# Patient Record
Sex: Female | Born: 1935 | Race: White | Hispanic: No | Marital: Married | Smoking: Never smoker
Health system: Southern US, Community
[De-identification: ages and names within clinical notes are randomized; demographics above are authoritative.]

## PROBLEM LIST (undated history)

## (undated) DIAGNOSIS — T7840XA Allergy, unspecified, initial encounter: Secondary | ICD-10-CM

## (undated) DIAGNOSIS — M810 Age-related osteoporosis without current pathological fracture: Secondary | ICD-10-CM

## (undated) DIAGNOSIS — I809 Phlebitis and thrombophlebitis of unspecified site: Secondary | ICD-10-CM

## (undated) DIAGNOSIS — I10 Essential (primary) hypertension: Secondary | ICD-10-CM

## (undated) HISTORY — DX: Allergy, unspecified, initial encounter: T78.40XA

---

## 2000-05-21 ENCOUNTER — Emergency Department (HOSPITAL_COMMUNITY): Admission: EM | Admit: 2000-05-21 | Discharge: 2000-05-21 | Payer: Self-pay | Admitting: Emergency Medicine

## 2000-05-21 ENCOUNTER — Encounter: Payer: Self-pay | Admitting: Emergency Medicine

## 2001-07-11 ENCOUNTER — Encounter: Payer: Self-pay | Admitting: Internal Medicine

## 2001-07-11 ENCOUNTER — Ambulatory Visit (HOSPITAL_COMMUNITY): Admission: RE | Admit: 2001-07-11 | Discharge: 2001-07-11 | Payer: Self-pay | Admitting: Internal Medicine

## 2009-02-18 ENCOUNTER — Emergency Department (HOSPITAL_COMMUNITY): Admission: EM | Admit: 2009-02-18 | Discharge: 2009-02-18 | Payer: Self-pay | Admitting: Emergency Medicine

## 2009-02-18 ENCOUNTER — Ambulatory Visit: Payer: Self-pay | Admitting: Vascular Surgery

## 2009-02-18 ENCOUNTER — Encounter (INDEPENDENT_AMBULATORY_CARE_PROVIDER_SITE_OTHER): Payer: Self-pay | Admitting: Emergency Medicine

## 2011-11-13 ENCOUNTER — Ambulatory Visit (INDEPENDENT_AMBULATORY_CARE_PROVIDER_SITE_OTHER): Payer: Medicare Other | Admitting: Emergency Medicine

## 2011-11-13 DIAGNOSIS — B353 Tinea pedis: Secondary | ICD-10-CM

## 2011-11-13 MED ORDER — TERBINAFINE HCL 250 MG PO TABS: 250.0000 mg | ORAL_TABLET | Freq: Every day | ORAL | Status: DC

## 2011-11-13 MED ORDER — TERBINAFINE HCL 250 MG PO TABS: 250.0000 mg | ORAL_TABLET | Freq: Every day | ORAL | Status: AC

## 2011-11-13 NOTE — Progress Notes (Signed)
   Date:  11/13/2011   Name:  Jillian Barker   DOB:  May 29, 1928   MRN:  [id number]  PCP:  No primary provider on file.    Chief Complaint: Rash   History of Present Illness:  Jillian Barker is a 78 y.o. very pleasant female patient who presents with the following:  Expanding red scaling rash on both feet starts on soles and now advancing.  Clearing in centers.  There is no problem list on file for this patient.   No past medical history on file.  No past surgical history on file.  History  Substance Use Topics  . Smoking status: Not on file  . Smokeless tobacco: Not on file  . Alcohol Use: Not on file    No family history on file.  Allergies  Allergen Reactions  . Penicillins Other (See Comments)    Pass out     Medication list has been reviewed and updated.  Current Outpatient Prescriptions on File Prior to Visit  Medication Sig Dispense Refill  . triamterene-hydrochlorothiazide (MAXZIDE-25) 37.5-25 MG per tablet Take 1 tablet by mouth daily.        Review of Systems:  As per HPI, otherwise negative.    Physical Examination: There were no vitals filed for this visit. There were no vitals filed for this visit. There is no height or weight on file to calculate BMI. Ideal Body Weight:     GEN: WDWN, NAD, Non-toxic, Alert & Oriented x 3 HEENT: Atraumatic, Normocephalic.  Ears and Nose: No external deformity. EXTR: No clubbing/cyanosis/edema NEURO: Normal gait.  PSYCH: Normally interactive. Conversant. Not depressed or anxious appearing.  Calm demeanor.  Tinea pedis   Assessment and Plan: Tinea pedis Lamisil Follow up as needed  Carmelina Dane, MD

## 2011-11-13 NOTE — Patient Instructions (Signed)
Athlete's Foot Athlete's foot (tinea pedis) is a fungal infection of the skin on the feet. It often occurs on the skin between the toes or underneath the toes. It can also occur on the soles of the feet. Athlete's foot is more likely to occur in hot, humid weather. Not washing your feet or changing your socks often enough can contribute to athlete's foot. The infection can spread from person to person (contagious). CAUSES Athlete's foot is caused by a fungus. This fungus thrives in warm, moist places. Most people get athlete's foot by sharing shower stalls, towels, and wet floors with an infected person. People with weakened immune systems, including those with diabetes, may be more likely to get athlete's foot. SYMPTOMS   Itchy areas between the toes or on the soles of the feet.   White, flaky, or scaly areas between the toes or on the soles of the feet.   Tiny, intensely itchy blisters between the toes or on the soles of the feet.   Tiny cuts on the skin. These cuts can develop a bacterial infection.   Thick or discolored toenails.  DIAGNOSIS  Your caregiver can usually tell what the problem is by doing a physical exam. Your caregiver may also take a skin sample from the rash area. The skin sample may be examined under a microscope, or it may be tested to see if fungus will grow in the sample. A sample may also be taken from your toenail for testing. TREATMENT  Over-the-counter and prescription medicines can be used to kill the fungus. These medicines are available as powders or creams. Your caregiver can suggest medicines for you. Fungal infections respond slowly to treatment. You may need to continue using your medicine for several weeks. PREVENTION   Do not share towels.   Wear sandals in wet areas, such as shared locker rooms and shared showers.   Keep your feet dry. Wear shoes that allow air to circulate. Wear cotton or wool socks.  HOME CARE INSTRUCTIONS   Take medicines as  directed by your caregiver. Do not use steroid creams on athlete's foot.   Keep your feet clean and cool. Wash your feet daily and dry them thoroughly, especially between your toes.   Change your socks every day. Wear cotton or wool socks. In hot climates, you may need to change your socks 2 to 3 times per day.   Wear sandals or canvas tennis shoes with good air circulation.   If you have blisters, soak your feet in Burow's solution or Epsom salts for 20 to 30 minutes, 2 times a day to dry out the blisters. Make sure you dry your feet thoroughly afterward.  SEEK MEDICAL CARE IF:   You have a fever.   You have swelling, soreness, warmth, or redness in your foot.   You are not getting better after 7 days of treatment.   You are not completely cured after 30 days.   You have any problems caused by your medicines.  MAKE SURE YOU:   Understand these instructions.   Will watch your condition.   Will get help right away if you are not doing well or get worse.  Document Released: 04/10/2000 Document Revised: 04/02/2011 Document Reviewed: 01/30/2011 [employer] Patient Information 2012 [employer]. 

## 2016-07-04 ENCOUNTER — Emergency Department (HOSPITAL_COMMUNITY)
Admission: EM | Admit: 2016-07-04 | Discharge: 2016-07-04 | Disposition: A | Discharge status: Home / Self Care | Payer: Medicare Other | Attending: Emergency Medicine | Admitting: Emergency Medicine

## 2016-07-04 ENCOUNTER — Encounter (HOSPITAL_COMMUNITY): Payer: Self-pay | Admitting: Nurse Practitioner

## 2016-07-04 ENCOUNTER — Emergency Department (HOSPITAL_COMMUNITY): Payer: Medicare Other

## 2016-07-04 DIAGNOSIS — M25561 Pain in right knee: Secondary | ICD-10-CM | POA: Diagnosis present

## 2016-07-04 DIAGNOSIS — M1711 Unilateral primary osteoarthritis, right knee: Secondary | ICD-10-CM | POA: Insufficient documentation

## 2016-07-04 DIAGNOSIS — I10 Essential (primary) hypertension: Secondary | ICD-10-CM | POA: Diagnosis not present

## 2016-07-04 HISTORY — DX: Age-related osteoporosis without current pathological fracture: M81.0

## 2016-07-04 HISTORY — DX: Essential (primary) hypertension: I10

## 2016-07-04 HISTORY — DX: Phlebitis and thrombophlebitis of unspecified site: I80.9

## 2016-07-04 MED ORDER — ACETAMINOPHEN 325 MG PO TABS
650.0000 mg | ORAL_TABLET | Freq: Once | ORAL | Status: AC
  Administered 2016-07-04: 650 mg via ORAL
  Filled 2016-07-04: qty 2

## 2016-07-04 MED ORDER — IBUPROFEN 200 MG PO TABS
200.0000 mg | ORAL_TABLET | Freq: Once | ORAL | Status: AC
  Administered 2016-07-04: 200 mg via ORAL
  Filled 2016-07-04: qty 1

## 2016-07-04 NOTE — ED Triage Notes (Signed)
Patient presents to [facility] via [facility] from home with complaints of pain in the right knee with new swelling. She says the pain became unbearable. She denies injury, was able to bear weight and was able to descend stairs with EMS.

## 2016-07-04 NOTE — ED Notes (Signed)
Bed: WA03 Expected date:  Expected time:  Means of arrival:  Comments: 83 yo R knee swelling

## 2016-07-04 NOTE — ED Provider Notes (Signed)
[facility] DEPT Provider Note   CSN: [id number] Arrival date & time: 07/04/16  1321     History   Chief Complaint Chief Complaint  Patient presents with  . Knee Pain    right    HPI Jillian Barker is a 83 y.o. female.  Patient c/o right knee pain the past couple days. Pain constant, dull, moderate, worse w twisting/moving certain ways. No redness. No fever or chills. No hip or ankle pain. Denies calf pain or swelling. No numbness/weakness. States hx 'cartilage injury' to that knee previously.    The history is provided by the patient.  Knee Pain   Pertinent negatives include no numbness.    Past Medical History:  Diagnosis Date  . Hypertension   . Osteoporosis   . Superficial thrombophlebitis     There are no active problems to display for this patient.   History reviewed. No pertinent surgical history.  OB History    No data available       Home Medications    Prior to Admission medications   Medication Sig Start Date End Date Taking? Authorizing Provider  triamterene-hydrochlorothiazide (MAXZIDE-25) 37.5-25 MG per tablet Take 1 tablet by mouth daily.    Historical Provider, MD    Family History No family history on file.  Social History Social History  Substance Use Topics  . Smoking status: Not on file  . Smokeless tobacco: Not on file  . Alcohol use Not on file     Allergies   Penicillins   Review of Systems Review of Systems  Constitutional: Negative for chills and fever.  Cardiovascular: Negative for leg swelling.  Skin: Negative for rash and wound.  Neurological: Negative for numbness.     Physical Exam Updated Vital Signs BP 175/66 (BP Location: Right Arm)   Pulse 60   Temp 97.9 F (36.6 C) (Oral)   Resp 17   SpO2 100%   Physical Exam  Constitutional: She appears well-developed and well-nourished. No distress.  Eyes: Conjunctivae are normal. No scleral icterus.  Neck: Neck supple. No tracheal deviation present.    Cardiovascular: Normal rate and intact distal pulses.   Pulmonary/Chest: Effort normal. No respiratory distress.  Abdominal: Normal appearance.  Musculoskeletal: She exhibits no edema.  Mild tenderness right knee. Knee grossly stable. No large effusion. No erythema or increased warmth. Distal pulses palp. No pain w passive rom knee.   Neurological: She is alert.  Skin: Skin is warm and dry. No rash noted. She is not diaphoretic.  Psychiatric: She has a normal mood and affect.  Nursing note and vitals reviewed.    ED Treatments / Results  Labs (all labs ordered are listed, but only abnormal results are displayed) Labs Reviewed - No data to display  EKG  EKG Interpretation None       Radiology Dg Knee Complete 4 Views Right  Result Date: 07/04/2016 CLINICAL DATA:  Worsening right knee pain and swelling. No reported injury. EXAM: RIGHT KNEE - COMPLETE 4+ VIEW COMPARISON:  None. FINDINGS: Moderate suprapatellar right knee joint effusion. No fracture, dislocation or suspicious focal osseous lesion. Chondrocalcinosis in the medial and lateral menisci. Severe tricompartmental osteoarthritis, most prominent in the patellofemoral greater than medial compartments. Vascular calcifications in the posterior soft tissues. IMPRESSION: 1. No fracture or dislocation . 2. Moderate suprapatellar right knee joint effusion. 3. 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 Electronically Signed   By: Jannifer RodneyJason A Poff M.D.  On: 07/04/2016 14:25    Procedures Procedures (including critical care time)  Medications Ordered in ED Medications - No data to display   Initial Impression / Assessment and Plan / ED Course  I have reviewed the triage vital signs and the nursing notes.  Pertinent labs & imaging results that were available during my care of the patient were reviewed by me and  considered in my medical decision making (see chart for details).  Xrays.  Motrin po.  Reviewed nursing notes and prior charts for additional history.   No sign of infection on current exam.  Pt appears stable for d/c.  rec ortho f/u.  Return precautions provided.     Final Clinical Impressions(s) / ED Diagnoses   Final diagnoses:  None    New Prescriptions New Prescriptions   No medications on file     Cathren Laine, MD 07/04/16 1622

## 2016-07-04 NOTE — Discharge Instructions (Signed)
It was our pleasure to provide your ER care today - we hope that you feel better.  Take acetaminophen, and/or ibuprofen as need for pain.  Apply cold/icepack to area intermittently to help with symptom relief.  Follow up with orthopedist in the next couple weeks - see referral - call office Monday to arrange follow up.  Return to ER if worse, new symptoms, fevers, spreading redness/swelling, intractable pain, other concern.

## 2016-08-11 ENCOUNTER — Ambulatory Visit (INDEPENDENT_AMBULATORY_CARE_PROVIDER_SITE_OTHER): Payer: Medicare Other | Admitting: Family Medicine

## 2016-08-11 VITALS — BP 157/76 | HR 72 | Temp 98.5°F | Resp 12 | Ht 59.0 in | Wt 133.0 lb

## 2016-08-11 DIAGNOSIS — J3489 Other specified disorders of nose and nasal sinuses: Secondary | ICD-10-CM | POA: Diagnosis not present

## 2016-08-11 DIAGNOSIS — H00025 Hordeolum internum left lower eyelid: Secondary | ICD-10-CM | POA: Diagnosis not present

## 2016-08-11 DIAGNOSIS — H04203 Unspecified epiphora, bilateral lacrimal glands: Secondary | ICD-10-CM | POA: Diagnosis not present

## 2016-08-11 MED ORDER — ERYTHROMYCIN 5 MG/GM OP OINT: 1.0000 "application " | TOPICAL_OINTMENT | Freq: Three times a day (TID) | OPHTHALMIC | 0 refills | Status: AC

## 2016-08-11 NOTE — Patient Instructions (Addendum)
You appear to have a stye on the left lower eyelid. I do not see signs of infection around the eyelid at this time, but would recommend using the antibiotic ointment 1/2 inch ribbon to the lower eyelid 3 times per day, and warm compresses 4-5 times per day.   For the watery eyes, you can use over-the-counter Systane or Refresh until seen by your eye care provider to discuss other treatments and testing. Please follow-up with your eye care provider within the next 3 days to recheck left eyelid as well as discuss other eye symptoms.   Eye symptoms and nose/sinus symptoms may be related to allergies. Try Claritin over-the-counter once per day. If any sedation, lightheadedness or dizziness with Claritin, do not use that any further. For nasal crusting and irritation, I would recommend over-the-counter saline nasal spray 3-4 times per day.  Return to the clinic or go to the nearest emergency room if any of your symptoms worsen or new symptoms occur.  Stye A stye is a bump on your eyelid caused by a bacterial infection. A stye can form inside the eyelid (internal stye) or outside the eyelid (external stye). An internal stye may be caused by an infected oil-producing gland inside your eyelid. An external stye may be caused by an infection at the base of your eyelash (hair follicle). Styes are very common. Anyone can get them at any age. They usually occur in just one eye, but you may have more than one in either eye. What are the causes? The infection is almost always caused by bacteria called Staphylococcus aureus. This is a common type of bacteria that lives on your skin. What increases the risk? You may be at higher risk for a stye if you have had one before. You may also be at higher risk if you have:  Diabetes.  Long-term illness.  Long-term eye redness.  A skin condition called seborrhea.  High fat levels in your blood (lipids). What are the signs or symptoms? Eyelid pain is the most  common symptom of a stye. Internal styes are more painful than external styes. Other signs and symptoms may include:  Painful swelling of your eyelid.  A scratchy feeling in your eye.  Tearing and redness of your eye.  Pus draining from the stye. How is this diagnosed? Your health care provider may be able to diagnose a stye just by examining your eye. The health care provider may also check to make sure:  You do not have a fever or other signs of a more serious infection.  The infection has not spread to other parts of your eye or areas around your eye. How is this treated? Most styes will clear up in a few days without treatment. In some cases, you may need to use antibiotic drops or ointment to prevent infection. Your health care provider may have to drain the stye surgically if your stye is:  Large.  Causing a lot of pain.  Interfering with your vision. This can be done using a thin blade or a needle. Follow these instructions at home:  Take medicines only as directed by your health care provider.  Apply a clean, warm compress to your eye for 10 minutes, 4 times a day.  Do not wear contact lenses or eye makeup until your stye has healed.  Do not try to pop or drain the stye. Contact a health care provider if:  You have chills or a fever.  Your stye does not go away  after several days.  Your stye affects your vision.  Your eyeball becomes swollen, red, or painful. This information is not intended to replace advice given to you by your health care provider. Make sure you discuss any questions you have with your health care provider. Document Released: 01/21/2005 Document Revised: 12/08/2015 Document Reviewed: 07/28/2013 Elsevier Interactive Patient Education  2017 [employer].    IF you received an x-ray today, you will receive an invoice from [facility]. Please contact [facility] at [phone] with questions or concerns regarding your  invoice.   IF you received labwork today, you will receive an invoice from [facility]. Please contact [facility] at [phone] with questions or concerns regarding your invoice.   Our billing staff will not be able to assist you with questions regarding bills from these companies.  You will be contacted with the lab results as soon as they are available. The fastest way to get your results is to activate your My Chart account. Instructions are located on the last page of this paperwork. If you have not heard from [place] regarding the results in 2 weeks, please contact this office.

## 2016-08-11 NOTE — Progress Notes (Signed)
Subjective:  By signing my name below, I, Jillian Barker, attest that this documentation has been prepared under the direction and in the presence of [name], MD Electronically Signed: Charline Bills, ED Scribe 08/11/2016 at 4:14 PM.   Patient ID: Jillian Barker, female    DOB: Dec 30, 1928, 83 y.o.   MRN: [id number]  Chief Complaint  Patient presents with  . Sinusitis    watery, itchy eyes  . other    pt would not cooperate for temperature   HPI  Jillian Barker is a 83 y.o. female who presents to Primary Care at [facility] complaining of bilateral itchy and watery eyes over the past 4 days. Pt states symptoms started as nasal congestion 4 months ago that was never treated. She reports associated symptoms of rhinorrhea, swelling surrounding both eyes and a stye forming to the left eye over the past few days. Pt has tried ibuprofen and applying a warm compress to her left eye without significant relief. She denies fever, sinus pressure, dental pain. Pt reports a h/o hay fever that she has occasionally treated with OTC antihistamines in the past. She is followed by optometrist Dr. Burgess Estelle.   There are no active problems to display for this patient.  Past Medical History:  Diagnosis Date  . Hypertension   . Osteoporosis   . Superficial thrombophlebitis    No past surgical history on file. Allergies  Allergen Reactions  . Penicillins Other (See Comments)    Pass out .Marland Kitchenas a child    Prior to Admission medications   Medication Sig Start Date End Date Taking? Authorizing Provider  CALCIUM-VITAMIN D PO Take 1 tablet by mouth daily.   Yes Historical Provider, MD  ibuprofen (ADVIL,MOTRIN) 200 MG tablet Take 400 mg by mouth every 6 (six) hours as needed.   Yes Historical Provider, MD  triamterene-hydrochlorothiazide (MAXZIDE-25) 37.5-25 MG per tablet Take 1 tablet by mouth daily.   Yes Historical Provider, MD  alendronate (FOSAMAX) 70 MG tablet Take 70 mg by mouth once a week.  05/09/16   Historical Provider, MD   Social History   Social History  . Marital status: Married    Spouse name: N/A  . Number of children: N/A  . Years of education: N/A   Occupational History  . Not on file.   Social History Main Topics  . Smoking status: Never Smoker  . Smokeless tobacco: Never Used  . Alcohol use Not on file  . Drug use: Unknown  . Sexual activity: Not on file   Other Topics Concern  . Not on file   Social History Narrative  . No narrative on file   Review of Systems  Constitutional: Negative for fever.  HENT: Positive for congestion, facial swelling (bilateral eyes) and rhinorrhea. Negative for dental problem and sinus pressure.   Eyes: Positive for discharge (watery) and itching.  Allergic/Immunologic: Positive for environmental allergies.      Objective:   Physical Exam  Constitutional: She is oriented to person, place, and time. She appears well-developed and well-nourished. No distress.  HENT:  Head: Normocephalic and atraumatic.  Right Ear: Hearing, tympanic membrane, external ear and ear canal normal.  Left Ear: Hearing, tympanic membrane, external ear and ear canal normal.  Mouth/Throat: Oropharynx is clear and moist and mucous membranes are normal. No oropharyngeal exudate.  Mouth/Throat: No postnasal drip.  Nose: Minimal dry mucosa in the nasal passages. Septum is normal. No bleeding. Unknown if she is having pain in sinuses.  Eyes:  EOM are normal. Pupils are equal, round, and reactive to light. Right conjunctiva is injected (minimal). Left conjunctiva is injected (minimal). Right eye exhibits no nystagmus. Left eye exhibits no nystagmus.  Hordeolum on lower medial third on L lower eyelid with some swelling of the infraorbital skin. No erythema of the infraorbital skin or rash. No rash along face or skin.  Neck: Neck supple. No tracheal deviation present.  Cardiovascular: Normal rate, regular rhythm, normal heart sounds and intact distal  pulses.   No murmur heard. Pulmonary/Chest: Effort normal and breath sounds normal. No respiratory distress. She has no wheezes. She has no rhonchi.  Musculoskeletal: Normal range of motion.  Lymphadenopathy:       Head (right side): No preauricular adenopathy present.       Head (left side): No preauricular adenopathy present.    She has no cervical adenopathy.  Neurological: She is alert and oriented to person, place, and time.  Skin: Skin is warm and dry. No rash noted.  Psychiatric: She has a normal mood and affect. Her behavior is normal.  Nursing note and vitals reviewed.  Vitals:   08/11/16 1603  BP: (!) 157/76  Pulse: 72  Resp: 12  SpO2: 98%  Weight: 133 lb (60.3 kg)  Height:  (1.499 m)  Declined repeat BP assessment.     Visual Acuity Screening   Right eye Left eye Both eyes  Without correction: 20/40 -1 20/30 -2 20/30 -1  With correction:       Assessment & Plan:   Jillian Barker is a 83 y.o. female Hordeolum internum of left lower eyelid - Plan: erythromycin (ROMYCIN) ophthalmic ointment  - No significant erythema or signs of preseptal cellulitis at this time.  -Warm compresses, erythromycin ointment 3 times a day, follow-up here or with eye care provider in the next few days for monitoring. RTC precautions if worsening.  Watery eyes Nasal crusting  -Possible allergic conjunctivitis versus dry eyes. Recommended symptomatic care with Systane, or refresh drops over-the-counter and follow-up with eye care provider.  -Can also try Claritin over-the-counter for possible allergic symptoms and saline nasal spray for nasal crusting.  If persistent sinus symptoms, return to discuss further but suspect allergic cause at this time.  Meds ordered this encounter  Medications  . erythromycin [facility]) ophthalmic ointment    Sig: Place 1 application into the left eye 3 (three) times daily.    Dispense:  3.5 g    Refill:  0   Patient Instructions    You appear  to have a stye on the left lower eyelid. I do not see signs of infection around the eyelid at this time, but would recommend using the antibiotic ointment 1/2 inch ribbon to the lower eyelid 3 times per day, and warm compresses 4-5 times per day.   For the watery eyes, you can use over-the-counter Systane or Refresh until seen by your eye care provider to discuss other treatments and testing. Please follow-up with your eye care provider within the next 3 days to recheck left eyelid as well as discuss other eye symptoms.   Eye symptoms and nose/sinus symptoms may be related to allergies. Try Claritin over-the-counter once per day. If any sedation, lightheadedness or dizziness with Claritin, do not use that any further. For nasal crusting and irritation, I would recommend over-the-counter saline nasal spray 3-4 times per day.  Return to the clinic or go to the nearest emergency room if any of your symptoms worsen or new symptoms  occur.  Stye A stye is a bump on your eyelid caused by a bacterial infection. A stye can form inside the eyelid (internal stye) or outside the eyelid (external stye). An internal stye may be caused by an infected oil-producing gland inside your eyelid. An external stye may be caused by an infection at the base of your eyelash (hair follicle). Styes are very common. Anyone can get them at any age. They usually occur in just one eye, but you may have more than one in either eye. What are the causes? The infection is almost always caused by bacteria called Staphylococcus aureus. This is a common type of bacteria that lives on your skin. What increases the risk? You may be at higher risk for a stye if you have had one before. You may also be at higher risk if you have:  Diabetes.  Long-term illness.  Long-term eye redness.  A skin condition called seborrhea.  High fat levels in your blood (lipids). What are the signs or symptoms? Eyelid pain is the most common symptom of  a stye. Internal styes are more painful than external styes. Other signs and symptoms may include:  Painful swelling of your eyelid.  A scratchy feeling in your eye.  Tearing and redness of your eye.  Pus draining from the stye. How is this diagnosed? Your health care provider may be able to diagnose a stye just by examining your eye. The health care provider may also check to make sure:  You do not have a fever or other signs of a more serious infection.  The infection has not spread to other parts of your eye or areas around your eye. How is this treated? Most styes will clear up in a few days without treatment. In some cases, you may need to use antibiotic drops or ointment to prevent infection. Your health care provider may have to drain the stye surgically if your stye is:  Large.  Causing a lot of pain.  Interfering with your vision. This can be done using a thin blade or a needle. Follow these instructions at home:  Take medicines only as directed by your health care provider.  Apply a clean, warm compress to your eye for 10 minutes, 4 times a day.  Do not wear contact lenses or eye makeup until your stye has healed.  Do not try to pop or drain the stye. Contact a health care provider if:  You have chills or a fever.  Your stye does not go away after several days.  Your stye affects your vision.  Your eyeball becomes swollen, red, or painful. This information is not intended to replace advice given to you by your health care provider. Make sure you discuss any questions you have with your health care provider. Document Released: 01/21/2005 Document Revised: 12/08/2015 Document Reviewed: 07/28/2013 Elsevier Interactive Patient Education  2017 [employer].    IF you received an x-ray today, you will receive an invoice from [facility]. Please contact [facility] at [phone] with questions or concerns regarding your invoice.   IF you  received labwork today, you will receive an invoice from [facility]. Please contact [facility] at [phone] with questions or concerns regarding your invoice.   Our billing staff will not be able to assist you with questions regarding bills from these companies.  You will be contacted with the lab results as soon as they are available. The fastest way to get your results is to activate your My Chart  account. Instructions are located on the last page of this paperwork. If you have not heard from [place] regarding the results in 2 weeks, please contact this office.      I personally performed the services described in this documentation, which was scribed in my presence. The recorded information has been reviewed and considered for accuracy and completeness, addended by me as needed, and agree with information above.  Signed,   Meredith Staggers, MD Primary Care at [facility].  08/11/16 7:56 PM

## 2019-01-05 IMAGING — CR DG KNEE COMPLETE 4+V*R*
4 series · 4 of 4 positions shown · non-contrast
Comparison: None.

CLINICAL DATA: Worsening right knee pain and swelling. No reported
injury.

EXAM:
RIGHT KNEE - COMPLETE 4+ VIEW

[x knee ap right (1 of 3)]
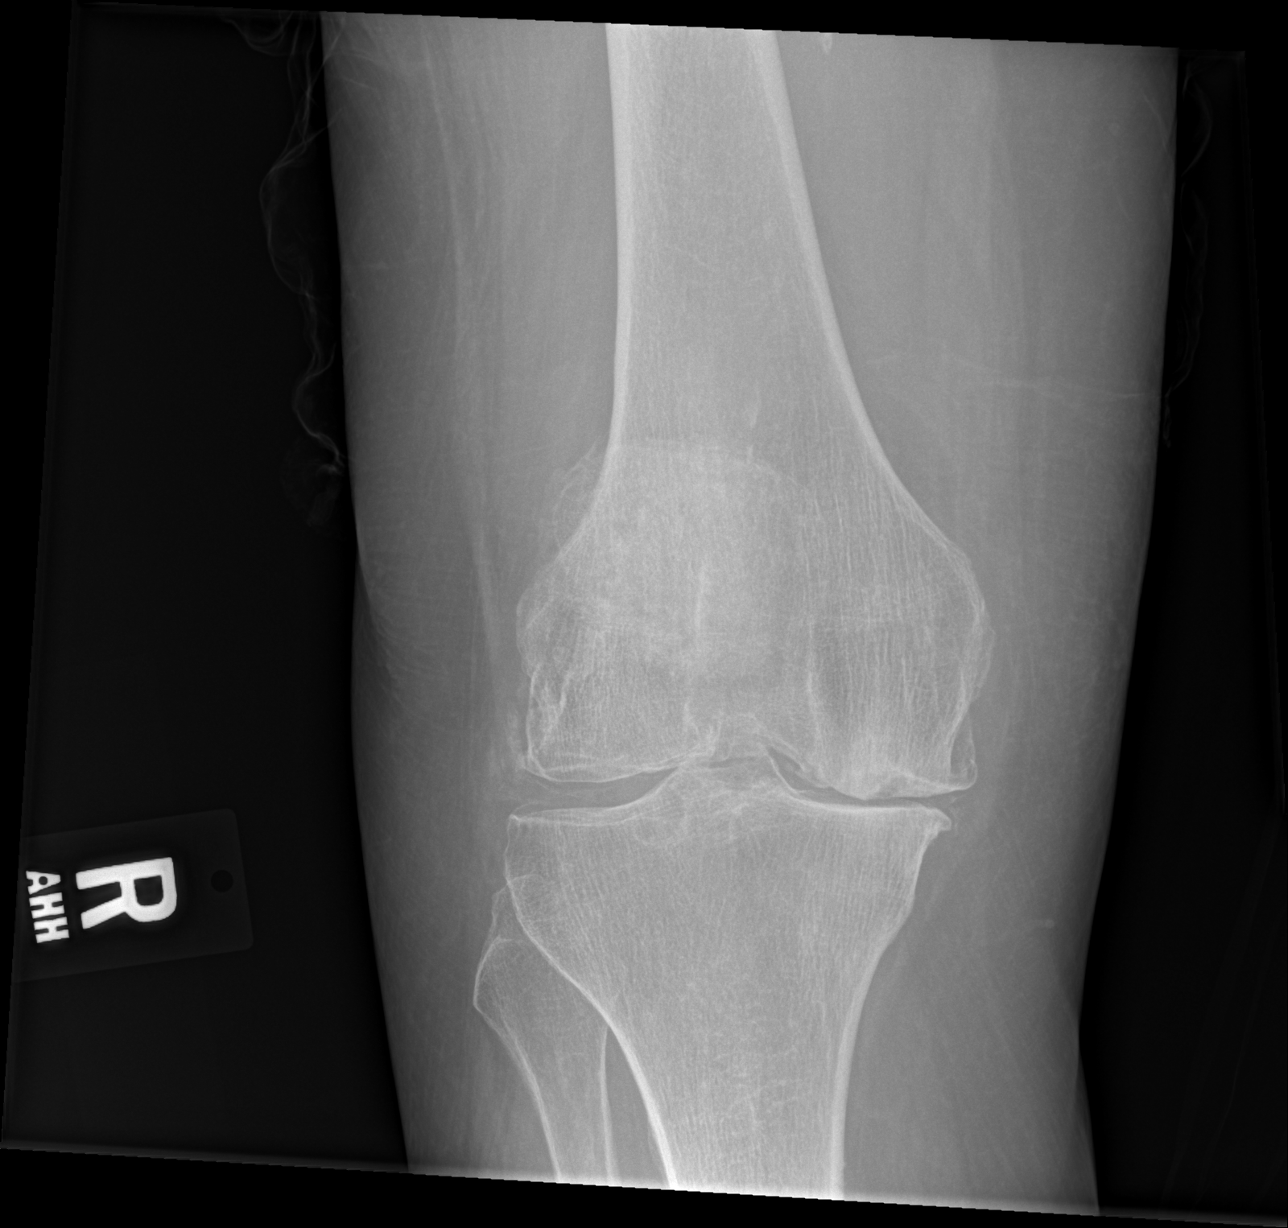

[x knee ap right (2 of 3)]
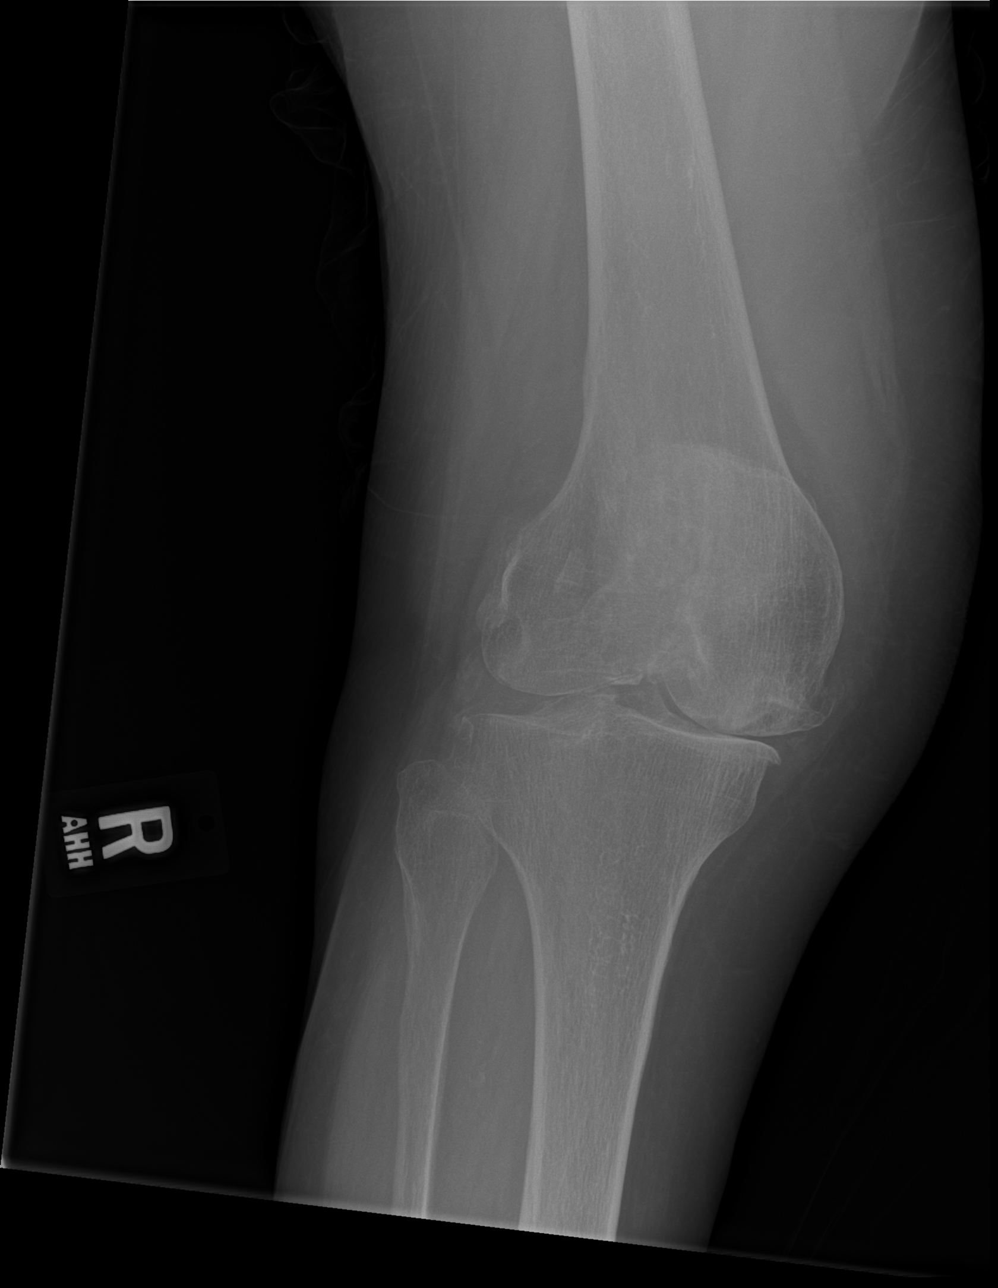

[x knee ap right (3 of 3)]
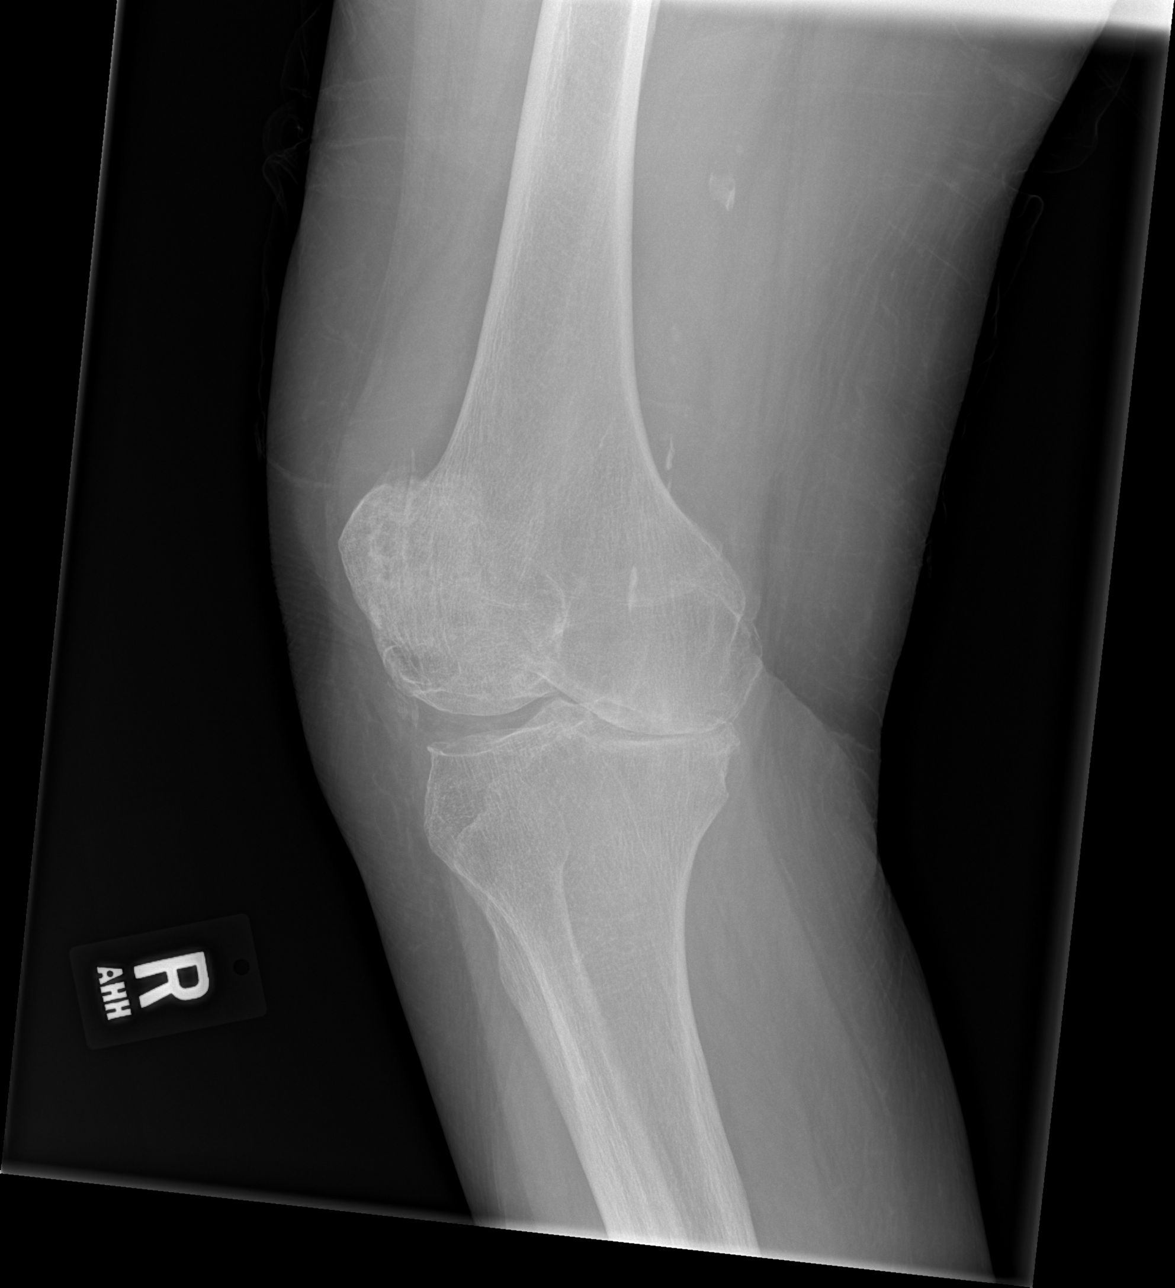

[x knee lat right]
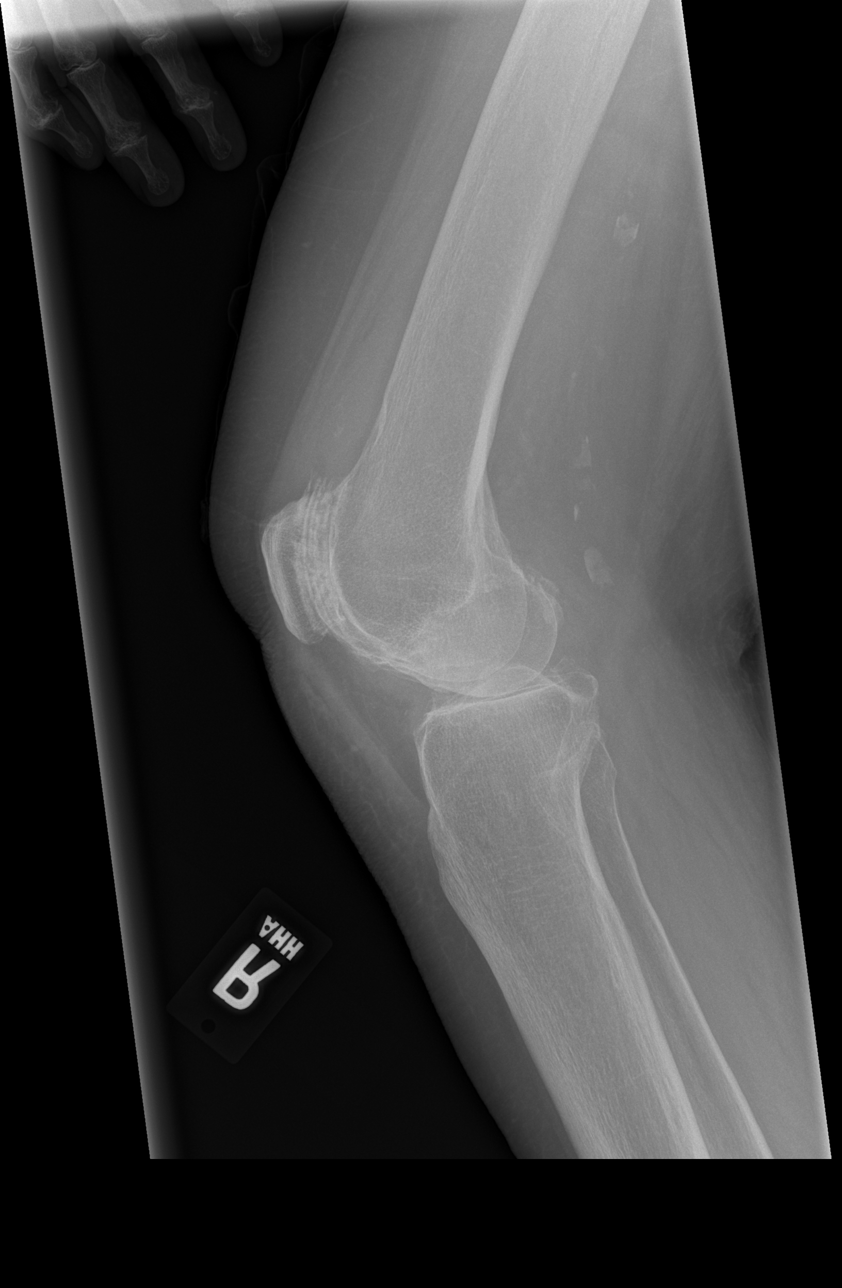

[4 of 4 positions shown; findings below may reference images not displayed]

FINDINGS: Moderate suprapatellar right knee joint effusion. No fracture,
dislocation or suspicious focal osseous lesion. Chondrocalcinosis in
the medial and lateral menisci. Severe tricompartmental
osteoarthritis, most prominent in the patellofemoral greater than
medial compartments. Vascular calcifications in the posterior soft
tissues.
IMPRESSION: 1. No fracture or dislocation .
2. Moderate suprapatellar right knee joint effusion.
3. Severe tricompartmental osteoarthritis, most prominent in the
patellofemoral compartment, probably due to CPPD arthropathy given
the meniscal chondrocalcinosis.

## 2020-04-18 ENCOUNTER — Encounter (HOSPITAL_COMMUNITY): Payer: Self-pay | Admitting: Emergency Medicine

## 2020-04-18 ENCOUNTER — Emergency Department (HOSPITAL_COMMUNITY): Payer: Medicare Other

## 2020-04-18 ENCOUNTER — Emergency Department (HOSPITAL_COMMUNITY)
Admission: EM | Admit: 2020-04-18 | Discharge: 2020-04-18 | Disposition: A | Discharge status: Home / Self Care | Payer: Medicare Other | Attending: Emergency Medicine | Admitting: Emergency Medicine

## 2020-04-18 DIAGNOSIS — S59902A Unspecified injury of left elbow, initial encounter: Secondary | ICD-10-CM | POA: Diagnosis present

## 2020-04-18 DIAGNOSIS — W19XXXA Unspecified fall, initial encounter: Secondary | ICD-10-CM

## 2020-04-18 DIAGNOSIS — G8929 Other chronic pain: Secondary | ICD-10-CM | POA: Insufficient documentation

## 2020-04-18 DIAGNOSIS — Z23 Encounter for immunization: Secondary | ICD-10-CM | POA: Insufficient documentation

## 2020-04-18 DIAGNOSIS — S51012A Laceration without foreign body of left elbow, initial encounter: Secondary | ICD-10-CM | POA: Diagnosis not present

## 2020-04-18 DIAGNOSIS — Z79899 Other long term (current) drug therapy: Secondary | ICD-10-CM | POA: Diagnosis not present

## 2020-04-18 DIAGNOSIS — M25562 Pain in left knee: Secondary | ICD-10-CM | POA: Insufficient documentation

## 2020-04-18 DIAGNOSIS — M25561 Pain in right knee: Secondary | ICD-10-CM | POA: Diagnosis not present

## 2020-04-18 DIAGNOSIS — W01198A Fall on same level from slipping, tripping and stumbling with subsequent striking against other object, initial encounter: Secondary | ICD-10-CM | POA: Diagnosis not present

## 2020-04-18 DIAGNOSIS — I10 Essential (primary) hypertension: Secondary | ICD-10-CM | POA: Diagnosis not present

## 2020-04-18 LAB — BASIC METABOLIC PANEL
Anion gap: 8 (ref 5–15)
BUN: 25 mg/dL — ABNORMAL HIGH (ref 8–23)
CO2: 24 mmol/L (ref 22–32)
Calcium: 8.8 mg/dL — ABNORMAL LOW (ref 8.9–10.3)
Chloride: 108 mmol/L (ref 98–111)
Creatinine, Ser: 0.68 mg/dL (ref 0.44–1.00)
GFR, Estimated: >60 mL/min (ref >60)
Glucose, Bld: 108 mg/dL — ABNORMAL HIGH (ref 70–99)
Potassium: 3.7 mmol/L (ref 3.5–5.1)
Sodium: 140 mmol/L (ref 135–145)

## 2020-04-18 LAB — CBG MONITORING, ED: Glucose-Capillary: 103 mg/dL — ABNORMAL HIGH (ref 70–99)

## 2020-04-18 LAB — CBC WITH DIFFERENTIAL/PLATELET
Abs Immature Granulocytes: 0.07 10*3/uL (ref 0.00–0.07)
Basophils Absolute: 0.1 10*3/uL (ref 0.0–0.1)
Basophils Relative: 0 %
Eosinophils Absolute: 0.1 10*3/uL (ref 0.0–0.5)
Eosinophils Relative: 1 %
HCT: 38.8 % (ref 36.0–46.0)
Hemoglobin: 12.7 g/dL (ref 12.0–15.0)
Immature Granulocytes: 1 %
Lymphocytes Relative: 10 %
Lymphs Abs: 1.5 10*3/uL (ref 0.7–4.0)
MCH: 31.8 pg (ref 26.0–34.0)
MCHC: 32.7 g/dL (ref 30.0–36.0)
MCV: 97 fL (ref 80.0–100.0)
Monocytes Absolute: 1 10*3/uL (ref 0.1–1.0)
Monocytes Relative: 7 %
Neutro Abs: 12.1 10*3/uL — ABNORMAL HIGH (ref 1.7–7.7)
Neutrophils Relative %: 81 %
Platelets: 199 10*3/uL (ref 150–400)
RBC: 4 MIL/uL (ref 3.87–5.11)
RDW: 14 % (ref 11.5–15.5)
WBC: 14.8 10*3/uL — ABNORMAL HIGH (ref 4.0–10.5)
nRBC: 0 % (ref 0.0–0.2)

## 2020-04-18 LAB — URINALYSIS, ROUTINE W REFLEX MICROSCOPIC
Bacteria, UA: NONE SEEN
Bilirubin Urine: NEGATIVE
Glucose, UA: NEGATIVE mg/dL
Hgb urine dipstick: NEGATIVE
Ketones, ur: NEGATIVE mg/dL
Nitrite: NEGATIVE
Protein, ur: NEGATIVE mg/dL
Specific Gravity, Urine: 1.017 (ref 1.005–1.030)
pH: 6 (ref 5.0–8.0)

## 2020-04-18 MED ORDER — ACETAMINOPHEN 500 MG PO TABS
1000.0000 mg | ORAL_TABLET | Freq: Once | ORAL | Status: AC
  Administered 2020-04-18: 01:00:00 1000 mg via ORAL
  Filled 2020-04-18: qty 2

## 2020-04-18 MED ORDER — LIDOCAINE-EPINEPHRINE (PF) 2 %-1:200000 IJ SOLN
20.0000 mL | Freq: Once | INTRAMUSCULAR | Status: AC
  Administered 2020-04-18: 20 mL via INTRADERMAL
  Filled 2020-04-18: qty 20

## 2020-04-18 MED ORDER — TETANUS-DIPHTH-ACELL PERTUSSIS 5-2.5-18.5 LF-MCG/0.5 IM SUSY
0.5000 mL | PREFILLED_SYRINGE | Freq: Once | INTRAMUSCULAR | Status: AC
  Administered 2020-04-18: 01:00:00 0.5 mL via INTRAMUSCULAR
  Filled 2020-04-18: qty 0.5

## 2020-04-18 NOTE — ED Provider Notes (Signed)
..  Laceration Repair  Date/Time: 04/18/2020 6:17 AM Performed by: Barkley Boards, PA-C Authorized by: Barkley Boards, PA-C   Consent:    Consent obtained:  Verbal   Consent given by:  Patient   Risks, benefits, and alternatives were discussed: yes     Risks discussed:  Infection, pain, poor cosmetic result, need for additional repair and poor wound healing   Alternatives discussed:  No treatment and delayed treatment Universal protocol:    Procedure explained and questions answered to patient or proxy's satisfaction: yes     Test results available: yes     Imaging studies available: yes     Required blood products, implants, devices, and special equipment available: yes     Site/side marked: yes     Immediately prior to procedure, a time out was called: yes     Patient identity confirmed:  Verbally with patient Anesthesia:    Anesthesia method:  Local infiltration   Local anesthetic:  Lidocaine 2% WITH epi Laceration details:    Location: arm.   Length (cm):  8 Pre-procedure details:    Preparation:  Patient was prepped and draped in usual sterile fashion and imaging obtained to evaluate for foreign bodies Exploration:    Hemostasis achieved with:  Direct pressure   Imaging obtained: x-ray     Imaging outcome: foreign body not noted     Wound exploration: wound explored through full range of motion and entire depth of wound visualized     Wound extent: fascia violated     Wound extent: no foreign bodies/material noted, no muscle damage noted, no nerve damage noted, no tendon damage noted, no underlying fracture noted and no vascular damage noted     Contaminated: no   Treatment:    Area cleansed with:  Saline   Amount of cleaning:  Extensive   Irrigation solution:  Sterile saline   Irrigation method:  Pressure wash   Visualized foreign bodies/material removed: no     Debridement:  None   Layers/structures repaired:  Deep dermal/superficial fascia Deep dermal/superficial  fascia:    Suture size:  3-0   Suture material:  Vicryl   Suture technique:  Simple interrupted   Number of sutures:  10 Skin repair:    Repair method:  Sutures   Suture size:  3-0   Suture material:  Prolene   Suture technique: Simple interrupted and horizontal mattress.   Number of sutures:  9 Repair type:    Repair type:  Intermediate Post-procedure details:    Dressing:  Sterile dressing and splint for protection   Procedure completion:  Tolerated well, no immediate complications      Barkley Boards, PA-C 04/18/20 0623    Ward, Layla Maw, DO 04/18/20 [phone]

## 2020-04-18 NOTE — Discharge Instructions (Addendum)
Your CT scan of your head and cervical spine showed no injury.  X-ray of your left arm showed no fracture.  We have repaired the laceration to your left elbow.  The sutures will need to be removed in approximately 10 days.  This may be done by your primary care doctor.  You may take Tylenol 1000 mg every 6 hours as needed for pain.  We have updated your tetanus vaccination today.  You may clean this wound gently with warm soap and water and apply over-the-counter Neosporin and then a dressing.  Your labs and urine showed no acute abnormality other than a slightly elevated white blood cell count which could be reactive after your fall.

## 2020-04-18 NOTE — ED Triage Notes (Signed)
Per EMS, pt from home, fell and hit her elbow and head on door. Pt is not on blood thinners, did not lose LOC. Has laceration on arm.

## 2020-04-18 NOTE — ED Provider Notes (Signed)
CHIEF COMPLAINT: Fall, left elbow laceration  HPI: Patient is an 86 year old female with history of hypertension, osteoporosis who presents to the emergency department after she fell tonight.  Has a left elbow laceration.  States that she went outside to get away from her son who was "driving me crazy" when she fell.  She is not sure why she fell.  States the storm door hit her left arm.  She is not sure if she hit her head.  She denies any headache.  She is not on blood thinners.  She complains of chronic lower extremity pain from her osteoporosis but denies any injury and has been ambulatory.  No numbness or weakness.  No preceding chest pain or shortness of breath.  No recent vomiting, diarrhea, fever, cough, bloody stools, melena.  ROS: See HPI Constitutional: no fever  Eyes: no drainage  ENT: no runny nose   Cardiovascular:  no chest pain  Resp: no SOB  GI: no vomiting GU: no dysuria Integumentary: no rash  Allergy: no hives  Musculoskeletal: no leg swelling  Neurological: no slurred speech ROS otherwise negative  PAST MEDICAL HISTORY/PAST SURGICAL HISTORY:  Past Medical History:  Diagnosis Date  . Allergy   . Hypertension   . Osteoporosis   . Superficial thrombophlebitis     MEDICATIONS:  Prior to Admission medications   Medication Sig Start Date End Date Taking? Authorizing Provider  alendronate (FOSAMAX) 70 MG tablet Take 70 mg by mouth once a week. 05/09/16   [provider]  CALCIUM-VITAMIN D PO Take 1 tablet by mouth daily.    [provider]  erythromycin [facility]) ophthalmic ointment Place 1 application into the left eye 3 (three) times daily. 08/11/16   [name], MD  ibuprofen (ADVIL,MOTRIN) 200 MG tablet Take 400 mg by mouth every 6 (six) hours as needed.    [provider]  triamterene-hydrochlorothiazide (MAXZIDE-25) 37.5-25 MG per tablet Take 1 tablet by mouth daily.    [provider]    ALLERGIES:  Allergies   Allergen Reactions  . Penicillins Other (See Comments)    Pass out .Marland Kitchenas a child     SOCIAL HISTORY:  Social History   Tobacco Use  . Smoking status: Never Smoker  . Smokeless tobacco: Never Used  Substance Use Topics  . Alcohol use: No    FAMILY HISTORY: No family history on file.  EXAM: BP (!) 156/77   Pulse 89   Temp (!) 97.5 F (36.4 C) (Oral)   Resp 18   SpO2 98%  CONSTITUTIONAL: Alert and oriented and responds appropriately to questions. Well-appearing; well-nourished; GCS 15, elderly, very hard of hearing HEAD: Normocephalic; atraumatic EYES: Conjunctivae clear, PERRL, EOMI ENT: normal nose; no rhinorrhea; moist mucous membranes; pharynx without lesions noted; no dental injury; no septal hematoma NECK: Supple, no meningismus, no LAD; no midline spinal tenderness, step-off or deformity; trachea midline CARD: RRR; S1 and S2 appreciated; no murmurs, no clicks, no rubs, no gallops RESP: Normal chest excursion without splinting or tachypnea; breath sounds clear and equal bilaterally; no wheezes, no rhonchi, no rales; no hypoxia or respiratory distress CHEST:  chest wall stable, no crepitus or ecchymosis or deformity, nontender to palpation; no flail chest ABD/GI: Normal bowel sounds; non-distended; soft, non-tender, no rebound, no guarding; no ecchymosis or other lesions noted PELVIS:  stable, nontender to palpation BACK:  The back appears normal and is non-tender to palpation, there is no CVA tenderness; no midline spinal tenderness, step-off or deformity EXT: Soft tissue  swelling and ecchymosis noted to the left elbow without bony deformity.  Compartments in the left arm are soft.  2+ right and left radial pulses on exam.  She has an 8 cm laceration to the posterior left elbow without sign of tendon involvement or foreign body.  She has tenderness over her knees bilaterally but no sign of traumatic injury.  She states this is chronic. SKIN: Normal color for age and race;  warm NEURO: Moves all extremities equally, reports normal sensation diffusely, cranial nerves II through XII intact, normal speech PSYCH: The patient's mood and manner are appropriate. Grooming and personal hygiene are appropriate.  MEDICAL DECISION MAKING: Patient here with fall.  Unclear what caused her to fall tonight.  Will obtain labs, urine.  EKG shows no ischemia, arrhythmia or interval abnormality.  Will update her tetanus vaccination.  Will repair laceration.  Will obtain x-ray of the left elbow, CT of the head and cervical spine.  She does have some pain over both knees on exam but is ambulatory here.  Have discussed obtaining x-rays which she declined stating this is chronic pain.  ED PROGRESS: Labs show mild leukocytosis which may be reactive.  She has no infectious symptoms.  Normal hemoglobin.  Normal electrolytes.  Normal glucose.  Urine does not appear infected today.  Laceration repaired.  Please see Jillian McDonald, PA-C's note.  Will place arm in sling for protection.  Discussed wound care instructions, return precautions.  She will follow up with her PCP to have her sutures removed.  Will discharge home.  She denies that anyone at home is harming her.  She feels safe at home.   At this time, I do not feel there is any life-threatening condition present. I have reviewed, interpreted and discussed all results (EKG, imaging, lab, urine as appropriate) and exam findings with patient/family. I have reviewed nursing notes and appropriate previous records.  I feel the patient is safe to be discharged home without further emergent workup and can continue workup as an outpatient as needed. Discussed usual and customary return precautions. Patient/family verbalize understanding and are comfortable with this plan.  Outpatient follow-up has been provided as needed. All questions have been answered.   EKG Interpretation  Date/Time:  Thursday April 18 2020 00:55:57 EST Ventricular Rate:  63 PR  Interval:    QRS Duration: 95 QT Interval:  413 QTC Calculation: 423 R Axis:   6 Text Interpretation: Sinus rhythm Atrial premature complex Confirmed by Rochele Raring [phone]) on 04/18/2020 8:44:42 AM          Jillian Barker was evaluated in Emergency Department on 04/18/2020 for the symptoms described in the history of present illness. She was evaluated in the context of the global COVID-19 pandemic, which necessitated consideration that the patient might be at risk for infection with the SARS-CoV-2 virus that causes COVID-19. Institutional protocols and algorithms that pertain to the evaluation of patients at risk for COVID-19 are in a state of rapid change based on information released by regulatory bodies including the CDC and federal and state organizations. These policies and algorithms were followed during the patient's care in the ED.       Jillian Barker, Layla Maw, DO 04/18/20 [phone]

## 2020-04-21 ENCOUNTER — Emergency Department (HOSPITAL_COMMUNITY)
Admission: EM | Admit: 2020-04-21 | Discharge: 2020-04-21 | Disposition: A | Discharge status: Home / Self Care | Payer: Medicare Other | Attending: Emergency Medicine | Admitting: Emergency Medicine

## 2020-04-21 ENCOUNTER — Encounter (HOSPITAL_COMMUNITY): Payer: Self-pay | Admitting: Emergency Medicine

## 2020-04-21 ENCOUNTER — Other Ambulatory Visit: Payer: Self-pay

## 2020-04-21 DIAGNOSIS — I10 Essential (primary) hypertension: Secondary | ICD-10-CM | POA: Diagnosis not present

## 2020-04-21 DIAGNOSIS — T148XXA Other injury of unspecified body region, initial encounter: Secondary | ICD-10-CM

## 2020-04-21 DIAGNOSIS — S4992XD Unspecified injury of left shoulder and upper arm, subsequent encounter: Secondary | ICD-10-CM | POA: Diagnosis present

## 2020-04-21 DIAGNOSIS — S5012XD Contusion of left forearm, subsequent encounter: Secondary | ICD-10-CM | POA: Diagnosis not present

## 2020-04-21 DIAGNOSIS — W19XXXD Unspecified fall, subsequent encounter: Secondary | ICD-10-CM | POA: Insufficient documentation

## 2020-04-21 DIAGNOSIS — Z4800 Encounter for change or removal of nonsurgical wound dressing: Secondary | ICD-10-CM | POA: Diagnosis not present

## 2020-04-21 DIAGNOSIS — Z79899 Other long term (current) drug therapy: Secondary | ICD-10-CM | POA: Diagnosis not present

## 2020-04-21 MED ORDER — BACITRACIN ZINC 500 UNIT/GM EX OINT
TOPICAL_OINTMENT | Freq: Two times a day (BID) | CUTANEOUS | Status: DC
  Filled 2020-04-21: qty 1.8

## 2020-04-21 NOTE — ED Provider Notes (Signed)
[facility]-EMERGENCY DEPT Provider Note   CSN: [id number] Arrival date & time: 04/21/20  1500     History Chief Complaint  Patient presents with  . Wound Check    Jillian Barker is a 86 y.o. female.  Patient is a 86 year old female who presents for wound check.  She was seen on December 23 after she fell, sustaining a 8 cm laceration to her left elbow.  She also had a big hematoma to the elbow.  She had imaging studies which showed no fracture.  She says that she has had bruising that has gone all the way down her arm now.  She was concerned about that and wanted to get checked.  She has a little bit of pain at the laceration site but no worsening pain.  No drainage.  She said that it bled from the laceration for the first day or 2 but has since stopped.  She said the dressing fell off and she has not been able to put it back on.        Past Medical History:  Diagnosis Date  . Allergy   . Hypertension   . Osteoporosis   . Superficial thrombophlebitis     There are no problems to display for this patient.   History reviewed. No pertinent surgical history.   OB History   No obstetric history on file.     History reviewed. No pertinent family history.  Social History   Tobacco Use  . Smoking status: Never Smoker  . Smokeless tobacco: Never Used  Substance Use Topics  . Alcohol use: No  . Drug use: No    Home Medications Prior to Admission medications   Medication Sig Start Date End Date Taking? Authorizing Provider  alendronate (FOSAMAX) 70 MG tablet Take 70 mg by mouth once a week. 05/09/16   [provider]  CALCIUM-VITAMIN D PO Take 1 tablet by mouth daily.    [provider]  erythromycin [facility]) ophthalmic ointment Place 1 application into the left eye 3 (three) times daily. 08/11/16   [name], MD  ibuprofen (ADVIL,MOTRIN) 200 MG tablet Take 400 mg by mouth every 6 (six) hours as needed.    [provider]  triamterene-hydrochlorothiazide (MAXZIDE-25) 37.5-25 MG per tablet Take 1 tablet by mouth daily.    [provider]    Allergies    Penicillins  Review of Systems   Review of Systems  Constitutional: Negative for fever.  Gastrointestinal: Negative for nausea and vomiting.  Musculoskeletal: Negative for arthralgias, back pain, joint swelling and neck pain.       Arm swelling and bruising  Skin: Positive for wound.  Neurological: Negative for weakness, numbness and headaches.    Physical Exam Updated Vital Signs BP (!) 153/68 (BP Location: Right Arm)   Pulse 67   Temp (!) 97.5 F (36.4 C) (Oral)   Resp 18   SpO2 96%   Physical Exam Constitutional:      Appearance: She is well-developed and well-nourished.  HENT:     Head: Normocephalic and atraumatic.  Cardiovascular:     Rate and Rhythm: Normal rate.  Pulmonary:     Effort: Pulmonary effort is normal.  Musculoskeletal:        General: Tenderness and edema present.     Cervical back: Normal range of motion and neck supple.     Comments: Patient has a healing laceration to the posterior left elbow.  There is no drainage.  No surrounding warmth or erythema.  She has some mild tenderness to the area.  There is no bony tenderness of the elbow.  She has a moderate size hematoma to the proximal forearm just distal to the olecranon.  She has some ecchymosis that extends down through the forearm and hand.  No warmth or erythema.  Compartments are soft.  There is no underlying tenderness to these areas.  Skin:    General: Skin is warm and dry.  Neurological:     Mental Status: She is alert and oriented to person, place, and time.  Psychiatric:        Mood and Affect: Mood and affect normal.     ED Results / Procedures / Treatments   Labs (all labs ordered are listed, but only abnormal results are displayed) Labs Reviewed - No data to display  EKG None  Radiology No results  found.  Procedures Procedures (including critical care time)  Medications Ordered in ED Medications - No data to display  ED Course  I have reviewed the triage vital signs and the nursing notes.  Pertinent labs & imaging results that were available during my care of the patient were reviewed by me and considered in my medical decision making (see chart for details).    MDM Rules/Calculators/A&P                          Patient with a healing laceration to her left arm.  She has a large amount of ecchymosis to the arm which is likely resulting from the large hematoma that she had more proximally in the forearm.  I do not see any suggestions of infection.  There is no active bleeding.  Her compartments are soft.  She has no bony tenderness to the arm.  The wound was cleaned and dressed with antibiotic ointment.  The patient and her niece were given ongoing wound care instructions.  The patient knows to make an appointment to follow-up with her PCP for suture removal.  Return precautions were given.  Her TDAP was updated on her last visit. Final Clinical Impression(s) / ED Diagnoses Final diagnoses:  Hematoma    Rx / DC Orders ED Discharge Orders    None       Rolan Bucco, MD 04/21/20 [phone]

## 2020-04-21 NOTE — ED Notes (Signed)
Wound cleaned. Antibiotic ointment applied. Wound wrapped. Wound care provided to niece. Niece verbalized understanding.

## 2020-04-21 NOTE — ED Notes (Signed)
Dr. Belfi at bedside 

## 2020-04-21 NOTE — Discharge Instructions (Addendum)
Keep the wound clean and dry.  Use antibiotic ointment and change the dressing at least once a day.  Try to keep your arm elevated which will help with the swelling.  Follow-up with your doctor as previously directed to have the stitches removed.  Return here if you have any worsening pain, redness or drainage from the wound.

## 2020-04-21 NOTE — ED Triage Notes (Signed)
Patient reports being seen at [facility] on 12/23 after a fall. Patient received stiches to a L arm lac and was discharged. Patient states the bruising has worsened and she has not been able to change the dressing at home. Patient's arm is bruised and swollen. Patient has good perfusion and arm is warm to the touch.

## 2020-06-04 ENCOUNTER — Ambulatory Visit (HOSPITAL_COMMUNITY)
Admission: RE | Admit: 2020-06-04 | Discharge: 2020-06-04 | Disposition: A | Discharge status: Home / Self Care | Payer: Medicare Other | Source: Ambulatory Visit | Attending: Internal Medicine | Admitting: Internal Medicine

## 2020-06-04 ENCOUNTER — Other Ambulatory Visit (HOSPITAL_COMMUNITY): Payer: Self-pay | Admitting: Internal Medicine

## 2020-06-04 ENCOUNTER — Other Ambulatory Visit: Payer: Self-pay

## 2020-06-04 DIAGNOSIS — R600 Localized edema: Secondary | ICD-10-CM

## 2021-06-22 ENCOUNTER — Encounter (HOSPITAL_COMMUNITY): Payer: Self-pay | Admitting: Emergency Medicine

## 2021-06-22 ENCOUNTER — Emergency Department (HOSPITAL_COMMUNITY): Payer: Medicare Other

## 2021-06-22 ENCOUNTER — Other Ambulatory Visit: Payer: Self-pay

## 2021-06-22 ENCOUNTER — Emergency Department (HOSPITAL_COMMUNITY)
Admission: EM | Admit: 2021-06-22 | Discharge: 2021-06-23 | Disposition: A | Discharge status: Home / Self Care | Payer: Medicare Other | Attending: Emergency Medicine | Admitting: Emergency Medicine

## 2021-06-22 DIAGNOSIS — I10 Essential (primary) hypertension: Secondary | ICD-10-CM | POA: Insufficient documentation

## 2021-06-22 DIAGNOSIS — K209 Esophagitis, unspecified without bleeding: Secondary | ICD-10-CM | POA: Insufficient documentation

## 2021-06-22 DIAGNOSIS — X58XXXA Exposure to other specified factors, initial encounter: Secondary | ICD-10-CM | POA: Diagnosis not present

## 2021-06-22 DIAGNOSIS — K222 Esophageal obstruction: Secondary | ICD-10-CM | POA: Diagnosis not present

## 2021-06-22 DIAGNOSIS — Z79899 Other long term (current) drug therapy: Secondary | ICD-10-CM | POA: Insufficient documentation

## 2021-06-22 DIAGNOSIS — T18128A Food in esophagus causing other injury, initial encounter: Secondary | ICD-10-CM

## 2021-06-22 MED ORDER — GLUCAGON HCL RDNA (DIAGNOSTIC) 1 MG IJ SOLR
1.0000 mg | Freq: Once | INTRAMUSCULAR | Status: AC
  Administered 2021-06-22: 1 mg via INTRAVENOUS
  Filled 2021-06-22: qty 1

## 2021-06-22 NOTE — ED Provider Notes (Signed)
[facility]-EMERGENCY DEPT Provider Note   CSN: [id number] Arrival date & time: 06/22/21  2145     History  Chief Complaint  Patient presents with   Food Bolus    Jillian Barker is a 88 y.o. female with history of hyperlipidemia presents today for possible food bolus.  Patient was eating dinner a few hours ago consisting of hamburger beef when she states she felt like something got stuck.  She has been trying to swallow and is having difficulty swallowing her own secretions but spits them up instead.  Has not been able to swallow solids or liquids successfully.  Still able to talk and breathe, but is constantly hiccuping/gagging.  Patient has no other complaints at this time.  Denies recent illness, fever, sore throat, history of recent food bolus, history of dysphagia.  The history is provided by the patient and medical records.      Home Medications Prior to Admission medications   Medication Sig Start Date End Date Taking? Authorizing Provider  alendronate (FOSAMAX) 70 MG tablet Take 70 mg by mouth once a week. 05/09/16   [provider]  CALCIUM-VITAMIN D PO Take 1 tablet by mouth daily.    [provider]  erythromycin [facility]) ophthalmic ointment Place 1 application into the left eye 3 (three) times daily. 08/11/16   [name], MD  ibuprofen (ADVIL,MOTRIN) 200 MG tablet Take 400 mg by mouth every 6 (six) hours as needed.    [provider]  triamterene-hydrochlorothiazide (MAXZIDE-25) 37.5-25 MG per tablet Take 1 tablet by mouth daily.    [provider]      Allergies    Penicillins    Review of Systems   Review of Systems  HENT:  Positive for trouble swallowing.    Physical Exam Updated Vital Signs BP (!) 209/81 (BP Location: Right Arm)    Pulse 66    Temp (!) 97.3 F (36.3 C) (Axillary)    Resp (!) 24    SpO2 100%  Physical Exam Vitals and nursing note reviewed.  Constitutional:      General: She  is not in acute distress.    Appearance: Normal appearance. She is well-developed. She is not ill-appearing or diaphoretic.  HENT:     Head: Normocephalic and atraumatic.     Jaw: No tenderness or pain on movement.     Nose: Nose normal.     Mouth/Throat:     Lips: Pink.     Mouth: Mucous membranes are moist.     Tongue: Tongue does not deviate from midline.     Pharynx: Uvula midline. No oropharyngeal exudate or posterior oropharyngeal erythema.     Tonsils: No tonsillar exudate or tonsillar abscesses.     Comments: Excess secretions noted in the oropharynx Observed that the patient continues to spit up salivary secretions throughout exam Eyes:     Conjunctiva/sclera: Conjunctivae normal.  Cardiovascular:     Rate and Rhythm: Normal rate and regular rhythm.  Pulmonary:     Effort: Pulmonary effort is normal.     Breath sounds: Normal breath sounds.  Abdominal:     Palpations: Abdomen is soft.  Musculoskeletal:        General: No swelling.     Cervical back: Neck supple.  Skin:    General: Skin is warm and dry.     Capillary Refill: Capillary refill takes less than 2 seconds.     Coloration: Skin is not cyanotic or pale.  Neurological:  Mental Status: She is alert and oriented to person, place, and time.  Psychiatric:        Mood and Affect: Mood normal.    ED Results / Procedures / Treatments   Labs (all labs ordered are listed, but only abnormal results are displayed) Labs Reviewed - No data to display  EKG None  Radiology DG Chest Portable 1 View  Result Date: 06/22/2021 CLINICAL DATA:  Choking. EXAM: PORTABLE CHEST 1 VIEW COMPARISON:  None. FINDINGS: No focal consolidation, pleural effusion or pneumothorax. The cardiac silhouette is within limits. Atherosclerotic calcification of the aorta. No acute osseous pathology. IMPRESSION: No active disease. Electronically Signed   By: Elgie Collard M.D.   On: 06/22/2021 22:15    Procedures Procedures     Medications Ordered in ED Medications  glucagon (human recombinant) (GLUCAGEN) injection 1 mg (has no administration in time range)    ED Course/ Medical Decision Making/ A&P                           Medical Decision Making Amount and/or Complexity of Data Reviewed Radiology: ordered and independent interpretation performed. Decision-making details documented in ED Course.   88 y.o. female presents to the ED for concern of Food Bolus  .  This involves an extensive number of treatment options, and is a complaint that carries with it a high risk of complications and morbidity.  The differential diagnosis includes food bolus, allergic reaction, tonsillitis or peritonsillar abscess.  Comorbidities that complicate the patient evaluation include hyperlipidemia.  Additional history obtained from internal/external records available via epic  Interpretation: I ordered imaging studies including chest xray .  I independently visualized and interpreted imaging which showed no acute cardiopulmonary pathology or visualized foreign body.  I agree with the radiologist interpretation  ED Course: Patient able to talk and breathe normally with mild interruptions from a cough/gagging.  Observed recurrent spitting up of salivary secretions.  Unable to tolerate foods or liquids at this time.  Considered tonsillitis or peritonsillar abscess, but physical exam and patient history not supportive of this.  Considered allergic reaction, patient has no known allergies and is not started any new medications recently.  No other physical exam evidence to support an allergic reaction.  Likely food bolus due to patient's recent dinner that has been lodged and patient is unable to pass secretions successfully.  Airway appears patent based on exam.  Believe patient may best be served with a consult from gastroenterology.  Attending Dr. Jeraldine Loots requested consultation with Dr. Levora Angel of Gastroenterology,  and  discussed exam and imaging findings as well as pertinent plan documented separately.  Recommended use of glucagon.  Social Determinants of Health include elderly population  Disposition: I discussed the patient and their case with my attending, Dr. Jeraldine Loots, who agreed with the proposed treatment course.  After consideration of the diagnostic results and the patient's response to treatment, I feel that the patient may benefit from further evaluation and work-up from gastroenterology.    Care transferred to oncoming provider who will follow up on remaining work-up and will determine disposition.        Final Clinical Impression(s) / ED Diagnoses Final diagnoses:  Food impaction of esophagus, initial encounter    Rx / DC Orders ED Discharge Orders     None         Sandrea Hammond 06/22/21 2250    Gerhard Munch, MD 06/23/21 2222

## 2021-06-22 NOTE — ED Triage Notes (Signed)
Patient arrives from home complaining of a possible food bolus. Patient was eating a salisbury steak microwave dinner when she felt like she was choking. Patient denies shortness of breath or difficulty breathing, but is having episodes of gagging and difficulty maintaining her secretions. Patient spitting up clear mucous. Lung sounds clear.

## 2021-06-23 ENCOUNTER — Emergency Department (HOSPITAL_COMMUNITY): Payer: Medicare Other | Admitting: Anesthesiology

## 2021-06-23 ENCOUNTER — Encounter (HOSPITAL_COMMUNITY)
Admission: EM | Disposition: A | Discharge status: Home / Self Care | Payer: Self-pay | Source: Home / Self Care | Attending: Emergency Medicine

## 2021-06-23 ENCOUNTER — Emergency Department (EMERGENCY_DEPARTMENT_HOSPITAL): Payer: Medicare Other | Admitting: Anesthesiology

## 2021-06-23 DIAGNOSIS — T18128A Food in esophagus causing other injury, initial encounter: Secondary | ICD-10-CM

## 2021-06-23 DIAGNOSIS — I10 Essential (primary) hypertension: Secondary | ICD-10-CM

## 2021-06-23 HISTORY — PX: FOREIGN BODY REMOVAL: SHX962

## 2021-06-23 HISTORY — PX: BIOPSY: SHX5522

## 2021-06-23 HISTORY — PX: ESOPHAGOGASTRODUODENOSCOPY (EGD) WITH PROPOFOL: SHX5813

## 2021-06-23 SURGERY — ESOPHAGOGASTRODUODENOSCOPY (EGD) WITH PROPOFOL
Anesthesia: General

## 2021-06-23 MED ORDER — FENTANYL CITRATE (PF) 100 MCG/2ML IJ SOLN
INTRAMUSCULAR | Status: AC
  Filled 2021-06-23: qty 2

## 2021-06-23 MED ORDER — SUGAMMADEX SODIUM 200 MG/2ML IV SOLN
INTRAVENOUS | Status: DC | PRN
  Administered 2021-06-23: 200 mg via INTRAVENOUS

## 2021-06-23 MED ORDER — PROPOFOL 10 MG/ML IV BOLUS
INTRAVENOUS | Status: DC | PRN
  Administered 2021-06-23 (×2): 10 mg via INTRAVENOUS
  Administered 2021-06-23: 100 mg via INTRAVENOUS

## 2021-06-23 MED ORDER — ROCURONIUM BROMIDE 10 MG/ML (PF) SYRINGE
PREFILLED_SYRINGE | INTRAVENOUS | Status: DC | PRN
Start: 2021-06-23 — End: 2021-06-23
  Administered 2021-06-23: 10 mg via INTRAVENOUS

## 2021-06-23 MED ORDER — LIDOCAINE 2% (20 MG/ML) 5 ML SYRINGE
INTRAMUSCULAR | Status: DC | PRN
Start: 2021-06-23 — End: 2021-06-23
  Administered 2021-06-23: 50 mg via INTRAVENOUS

## 2021-06-23 MED ORDER — ONDANSETRON HCL 4 MG/2ML IJ SOLN
INTRAMUSCULAR | Status: DC | PRN
  Administered 2021-06-23: 4 mg via INTRAVENOUS

## 2021-06-23 MED ORDER — FENTANYL CITRATE (PF) 100 MCG/2ML IJ SOLN
INTRAMUSCULAR | Status: DC | PRN
  Administered 2021-06-23: 50 ug via INTRAVENOUS

## 2021-06-23 MED ORDER — PROPOFOL 10 MG/ML IV BOLUS
INTRAVENOUS | Status: AC
  Filled 2021-06-23: qty 20

## 2021-06-23 MED ORDER — DEXAMETHASONE SODIUM PHOSPHATE 10 MG/ML IJ SOLN
INTRAMUSCULAR | Status: DC | PRN
  Administered 2021-06-23: 10 mg via INTRAVENOUS

## 2021-06-23 MED ORDER — SUCCINYLCHOLINE CHLORIDE 200 MG/10ML IV SOSY
PREFILLED_SYRINGE | INTRAVENOUS | Status: DC | PRN
  Administered 2021-06-23: 80 mg via INTRAVENOUS

## 2021-06-23 MED ORDER — PANTOPRAZOLE SODIUM 40 MG PO TBEC: 40.0000 mg | DELAYED_RELEASE_TABLET | Freq: Two times a day (BID) | ORAL | 2 refills | Status: AC

## 2021-06-23 MED ORDER — LACTATED RINGERS IV SOLN
INTRAVENOUS | Status: DC
  Administered 2021-06-23: 1000 mL via INTRAVENOUS

## 2021-06-23 SURGICAL SUPPLY — 15 items

## 2021-06-23 NOTE — Anesthesia Procedure Notes (Signed)
Procedure Name: Intubation Date/Time: 06/23/2021 1:05 AM Performed by: Gerald Leitz, CRNA Pre-anesthesia Checklist: Patient identified, Patient being monitored, Timeout performed, Emergency Drugs available and Suction available Patient Re-evaluated:Patient Re-evaluated prior to induction Oxygen Delivery Method: Circle system utilized Preoxygenation: Pre-oxygenation with 100% oxygen Induction Type: IV induction and Rapid sequence Ventilation: Mask ventilation without difficulty Laryngoscope Size: Mac and 3 Grade View: Grade I Tube type: Oral Tube size: 7.0 mm Number of attempts: 1 Airway Equipment and Method: Stylet Placement Confirmation: ETT inserted through vocal cords under direct vision, positive ETCO2 and breath sounds checked- equal and bilateral Secured at: 21 cm Tube secured with: Tape Dental Injury: Teeth and Oropharynx as per pre-operative assessment  Difficulty Due To: Difficult Airway- due to anterior larynx

## 2021-06-23 NOTE — Anesthesia Preprocedure Evaluation (Addendum)
Anesthesia Evaluation  Patient identified by MRN, date of birth, ID band Patient awake    Reviewed: Allergy & Precautions, NPO status , Patient's Chart, lab work & pertinent test results  History of Anesthesia Complications Negative for: history of anesthetic complications  Airway Mallampati: II  TM Distance: >3 FB Neck ROM: Full    Dental  (+) Partial Upper   Pulmonary neg pulmonary ROS,    Pulmonary exam normal        Cardiovascular hypertension, Normal cardiovascular exam     Neuro/Psych negative neurological ROS     GI/Hepatic Neg liver ROS, Food impaction   Endo/Other  negative endocrine ROS  Renal/GU negative Renal ROS  negative genitourinary   Musculoskeletal negative musculoskeletal ROS (+)   Abdominal   Peds  Hematology negative hematology ROS (+)   Anesthesia Other Findings   Reproductive/Obstetrics                            Anesthesia Physical Anesthesia Plan  ASA: 3 and emergent  Anesthesia Plan: General   Post-op Pain Management:    Induction: Intravenous, Rapid sequence and Cricoid pressure planned  PONV Risk Score and Plan: Ondansetron, Dexamethasone and Treatment may vary due to age or medical condition  Airway Management Planned: Oral ETT  Additional Equipment: None  Intra-op Plan:   Post-operative Plan: Extubation in OR  Informed Consent: I have reviewed the patients History and Physical, chart, labs and discussed the procedure including the risks, benefits and alternatives for the proposed anesthesia with the patient or authorized representative who has indicated his/her understanding and acceptance.     Dental advisory given  Plan Discussed with:   Anesthesia Plan Comments:        Anesthesia Quick Evaluation

## 2021-06-23 NOTE — Discharge Instructions (Addendum)

## 2021-06-23 NOTE — Anesthesia Postprocedure Evaluation (Signed)
Anesthesia Post Note  Patient: Jillian Barker  Procedure(s) Performed: ESOPHAGOGASTRODUODENOSCOPY (EGD) WITH PROPOFOL FOREIGN BODY REMOVAL BIOPSY     Patient location during evaluation: Endoscopy Anesthesia Type: General Level of consciousness: awake and alert Pain management: pain level controlled Vital Signs Assessment: post-procedure vital signs reviewed and stable Respiratory status: spontaneous breathing, nonlabored ventilation and respiratory function stable Cardiovascular status: blood pressure returned to baseline and stable Postop Assessment: no apparent nausea or vomiting Anesthetic complications: no   No notable events documented.  Last Vitals:  Vitals:   06/23/21 0030 06/23/21 0130  BP: (!) 183/87 (!) 168/79  Pulse: (!) 106 82  Resp:  14  Temp:    SpO2: 95% 99%    Last Pain:  Vitals:   06/22/21 2200  TempSrc: Axillary  PainSc:                  Lidia Collum

## 2021-06-23 NOTE — Op Note (Addendum)
[facility] Patient Name: Jillian Barker Procedure Date: 06/23/2021 MRN: [id number] Attending MD: Kathi Der , MD Date of Birth: 1928-06-12 CSN: [id number] Age: 88 Admit Type: Outpatient Procedure:                Upper GI endoscopy Indications:              Dysphagia, Foreign body in the esophagus Providers:                Kathi Der, MD, Cathlean Marseilles, RN, Harrington Challenger,                            Technician, Sherron Flemings Referring MD:              Medicines:                 Complications:            No immediate complications. Estimated Blood Loss:     Estimated blood loss was minimal. Procedure:                Pre-Anesthesia Assessment:                           - Prior to the procedure, a History and Physical                            was performed, and patient medications and                            allergies were reviewed. The patient's tolerance of                            previous anesthesia was also reviewed. The risks                            and benefits of the procedure and the sedation                            options and risks were discussed with the patient.                            All questions were answered, and informed consent                            was obtained. Prior Anticoagulants: The patient has                            taken no previous anticoagulant or antiplatelet                            agents. ASA Grade Assessment: III - A patient with                            severe systemic disease. After reviewing the risks  and benefits, the patient was deemed in                            satisfactory condition to undergo the procedure.                           After obtaining informed consent, the endoscope was                            passed under direct vision. Throughout the                            procedure, the patient's blood pressure, pulse, and                            oxygen  saturations were monitored continuously. The                            [id number]-[id number] ([id number] Olympus endoscope was introduced                            through the mouth, and advanced to the second part                            of duodenum. The upper GI endoscopy was performed                            with moderate difficulty due to presence of food.                            The patient tolerated the procedure well. Scope In: Scope Out: Findings:      Food was found in the middle third of the esophagus and in the lower       third of the esophagus. Removal of food was accomplished.      LA Grade D (one or more mucosal breaks involving at least 75% of       esophageal circumference) esophagitis with no bleeding was found in the       entire esophagus. Biopsies were taken with a cold forceps for histology.      A low-grade of narrowing Schatzki ring was found at the gastroesophageal       junction.      No gross lesions were noted in the entire examined stomach.      The cardia and gastric fundus were normal on retroflexion.      The duodenal bulb, first portion of the duodenum and second portion of       the duodenum were normal. Impression:               - Food in the middle third of the esophagus and in                            the lower third of the esophagus. Removal was  successful.                           - LA Grade D esophagitis with no bleeding. Biopsied.                           - Low-grade of narrowing Schatzki ring.                           - No gross lesions in the stomach.                           - Normal duodenal bulb, first portion of the                            duodenum and second portion of the duodenum. Moderate Sedation:      Moderate (conscious) sedation was personally administered by an       anesthesia professional. The following parameters were monitored: oxygen       saturation, heart rate, blood pressure, and response to  care. Recommendation:           - Patient has a contact number available for                            emergencies. The signs and symptoms of potential                            delayed complications were discussed with the                            patient. Return to normal activities tomorrow.                            Written discharge instructions were provided to the                            patient.                           - Full liquid diet.                           - Continue present medications.                           - Await pathology results.                           - Repeat upper endoscopy in 2 months to check                            healing.                           - Use Protonix (pantoprazole) 40 mg PO BID.                           -  Return to GI clinic in 2 months. Procedure Code(s):        --- Professional ---                           [phone], Esophagogastroduodenoscopy, flexible,                            transoral; with removal of foreign body(s)                           43239, Esophagogastroduodenoscopy, flexible,                            transoral; with biopsy, single or multiple Diagnosis Code(s):        --- Professional ---                           U04.540JT18.128A, Food in esophagus causing other injury,                            initial encounter                           K20.90, Esophagitis, unspecified without bleeding                           K22.2, Esophageal obstruction                           R13.10, Dysphagia, unspecified                           T18.108A, Unspecified foreign body in esophagus                            causing other injury, initial encounter CPT copyright 2019 [employer]. All rights reserved. The codes documented in this report are preliminary and upon coder review may  be revised to meet current compliance requirements. Kathi DerParag Rozell Kettlewell, MD Kathi DerParag Junaid Wurzer, MD 06/23/2021 1:28:24 AM This report  has been signed electronically. Number of Addenda: 0

## 2021-06-23 NOTE — Transfer of Care (Signed)
Immediate Anesthesia Transfer of Care Note  Patient: Jillian Barker  Procedure(s) Performed: Procedure(s): ESOPHAGOGASTRODUODENOSCOPY (EGD) WITH PROPOFOL (N/A) FOREIGN BODY REMOVAL BIOPSY  Patient Location: PACU  Anesthesia Type:General  Level of Consciousness: Alert, Awake, Oriented  Airway & Oxygen Therapy: Patient Spontanous Breathing  Post-op Assessment: Report given to RN  Post vital signs: Reviewed and stable  Last Vitals:  Vitals:   06/22/21 2300 06/23/21 0000  BP:  (!) 193/101  Pulse: 75 81  Resp: (!) 22   Temp:    SpO2: 99% 98%    Complications: No apparent anesthesia complications

## 2021-06-23 NOTE — Consult Note (Addendum)
Referring Provider:  EDP Primary Care Physician:  Pcp, No Primary Gastroenterologist: Gentry Fitz  Reason for Consultation: Food impaction  HPI: Jillian Barker is a 88 y.o. female presented to the emergency department with choking sensation.  According to patient, she was eating supper and started spitting up saliva.  She is not able to eat or drink since then.  We tried glucagon in the emergency department without any improvement.  She  was constantly spitting up saliva during history taking.  Denies previous episodes of dysphagia.  Denies any reflux.    Past Medical History:  Diagnosis Date   Allergy    Hypertension    Osteoporosis    Superficial thrombophlebitis     History reviewed. No pertinent surgical history.  Prior to Admission medications   Medication Sig Start Date End Date Taking? Authorizing Provider  alendronate (FOSAMAX) 70 MG tablet Take 70 mg by mouth once a week. 05/09/16   [provider]  CALCIUM-VITAMIN D PO Take 1 tablet by mouth daily.    [provider]  erythromycin [facility]) ophthalmic ointment Place 1 application into the left eye 3 (three) times daily. 08/11/16   [name], MD  ibuprofen (ADVIL,MOTRIN) 200 MG tablet Take 400 mg by mouth every 6 (six) hours as needed.    [provider]  triamterene-hydrochlorothiazide (MAXZIDE-25) 37.5-25 MG per tablet Take 1 tablet by mouth daily.    [provider]    Scheduled Meds: Continuous Infusions: PRN Meds:.  Allergies as of 06/22/2021 - Review Complete 06/22/2021  Allergen Reaction Noted   Penicillins Other (See Comments) 11/13/2011    No family history on file.  Social History   Socioeconomic History   Marital status: Married    Spouse name: Not on file   Number of children: Not on file   Years of education: Not on file   Highest education level: Not on file  Occupational History   Not on file  Tobacco Use   Smoking status: Never   Smokeless  tobacco: Never  Substance and Sexual Activity   Alcohol use: No   Drug use: No   Sexual activity: Not on file  Other Topics Concern   Not on file  Social History Narrative   Not on file   Social Determinants of Health   Financial Resource Strain: Not on file  Food Insecurity: Not on file  Transportation Needs: Not on file  Physical Activity: Not on file  Stress: Not on file  Social Connections: Not on file  Intimate Partner Violence: Not on file    Review of Systems: All negative except as stated above in HPI.  Physical Exam: Vital signs: Vitals:   06/22/21 2300 06/23/21 0000  BP: (!) 206/78 (!) 193/101  Pulse: 75 81  Resp: (!) 22   Temp:    SpO2: 99% 98%     General:   Elderly patient, in mild distress -constantly spitting up saliva Lungs:  Clear throughout to auscultation.   No wheezes, crackles, or rhonchi. No acute distress. Heart:  Regular rate and rhythm; no murmurs, clicks, rubs,  or gallops. Abdomen: Soft, nontender, nondistended, bowel sounds present, no peritoneal signs Neuro : Alert and oriented x3 Psych : Anxious Rectal:  Deferred  GI:  Lab Results: No results for input(s): WBC, HGB, HCT, PLT in the last 72 hours. BMET No results for input(s): NA, K, CL, CO2, GLUCOSE, BUN, CREATININE, CALCIUM in the last 72 hours. LFT No results for input(s): PROT, ALBUMIN, AST, ALT, ALKPHOS,  BILITOT, BILIDIR, IBILI in the last 72 hours. PT/INR No results for input(s): LABPROT, INR in the last 72 hours.   Studies/Results: DG Chest Portable 1 View  Result Date: 06/22/2021 CLINICAL DATA:  Choking. EXAM: PORTABLE CHEST 1 VIEW COMPARISON:  None. FINDINGS: No focal consolidation, pleural effusion or pneumothorax. The cardiac silhouette is within limits. Atherosclerotic calcification of the aorta. No acute osseous pathology. IMPRESSION: No active disease. Electronically Signed   By: Elgie Collard M.D.   On: 06/22/2021 22:15    Impression/Plan: -Food  impaction -Esophageal dysphagia  Recommendations ------------------------- -Plan for urgent EGD tonight. -Patient was not convinced that she has food impaction.  After talking to her for about 30 minutes with help of Endo nurse and Endo tech she finally agreed for procedure.  Approximately 90-minute spent in patient encounter.  More than 50% of the time spent in care coordination and face-to-face encounter.  I was initially contacted by ED physician at 1030.  Risks (bleeding, infection, bowel perforation that could require surgery, sedation-related changes in cardiopulmonary systems), benefits (identification and possible treatment of source of symptoms, exclusion of certain causes of symptoms), and alternatives (watchful waiting, radiographic imaging studies, empiric medical treatment)  were explained to patient/family in detail and patient wishes to proceed.    ADDENDUM : 90 minutes spent in the patient encounter does not include procedure time.   LOS: 0 days   Kathi Der  MD, FACP 06/23/2021, 12:56 AM  Contact #  [phone]

## 2021-06-23 NOTE — ED Notes (Signed)
Patient granddaughter made aware of finished procedure and states will come pick up patient. Patient given ice water per request, tolerating with no difficulty.

## 2021-06-23 NOTE — ED Notes (Signed)
Spoke with granddaughter Diona Foley per request who states she will pick patient up at discharge [phone]

## 2021-06-24 ENCOUNTER — Encounter (HOSPITAL_COMMUNITY): Payer: Self-pay | Admitting: Gastroenterology

## 2021-06-24 LAB — SURGICAL PATHOLOGY

## 2021-09-19 ENCOUNTER — Ambulatory Visit (HOSPITAL_COMMUNITY)
Admission: RE | Admit: 2021-09-19 | Discharge: 2021-09-19 | Disposition: A | Discharge status: Home / Self Care | Payer: Medicare Other | Source: Ambulatory Visit | Attending: Family Medicine | Admitting: Family Medicine

## 2021-09-19 ENCOUNTER — Other Ambulatory Visit (HOSPITAL_COMMUNITY): Payer: Self-pay | Admitting: Family Medicine

## 2021-09-19 DIAGNOSIS — I824Z2 Acute embolism and thrombosis of unspecified deep veins of left distal lower extremity: Secondary | ICD-10-CM | POA: Diagnosis present

## 2022-07-19 ENCOUNTER — Emergency Department (HOSPITAL_COMMUNITY): Payer: Medicare Other

## 2022-07-19 ENCOUNTER — Inpatient Hospital Stay (HOSPITAL_COMMUNITY)
Admission: EM | Admit: 2022-07-19 | Discharge: 2022-07-23 | DRG: 565 | Disposition: A | Discharge status: Skilled Nursing Facility | Payer: Medicare Other | Attending: Family Medicine | Admitting: Family Medicine

## 2022-07-19 ENCOUNTER — Other Ambulatory Visit: Payer: Self-pay

## 2022-07-19 DIAGNOSIS — T68XXXA Hypothermia, initial encounter: Secondary | ICD-10-CM

## 2022-07-19 DIAGNOSIS — Z66 Do not resuscitate: Secondary | ICD-10-CM | POA: Diagnosis not present

## 2022-07-19 DIAGNOSIS — K5900 Constipation, unspecified: Secondary | ICD-10-CM | POA: Diagnosis not present

## 2022-07-19 DIAGNOSIS — M810 Age-related osteoporosis without current pathological fracture: Secondary | ICD-10-CM | POA: Diagnosis present

## 2022-07-19 DIAGNOSIS — S2231XA Fracture of one rib, right side, initial encounter for closed fracture: Secondary | ICD-10-CM

## 2022-07-19 DIAGNOSIS — M6282 Rhabdomyolysis: Secondary | ICD-10-CM

## 2022-07-19 DIAGNOSIS — I10 Essential (primary) hypertension: Secondary | ICD-10-CM

## 2022-07-19 DIAGNOSIS — Z880 Allergy status to penicillin: Secondary | ICD-10-CM | POA: Diagnosis not present

## 2022-07-19 DIAGNOSIS — S2239XA Fracture of one rib, unspecified side, initial encounter for closed fracture: Secondary | ICD-10-CM

## 2022-07-19 DIAGNOSIS — S2223XA Sternal manubrial dissociation, initial encounter for closed fracture: Secondary | ICD-10-CM

## 2022-07-19 DIAGNOSIS — M25561 Pain in right knee: Secondary | ICD-10-CM | POA: Diagnosis not present

## 2022-07-19 DIAGNOSIS — R296 Repeated falls: Secondary | ICD-10-CM | POA: Diagnosis present

## 2022-07-19 DIAGNOSIS — R7989 Other specified abnormal findings of blood chemistry: Secondary | ICD-10-CM | POA: Diagnosis not present

## 2022-07-19 DIAGNOSIS — M25562 Pain in left knee: Secondary | ICD-10-CM | POA: Diagnosis present

## 2022-07-19 DIAGNOSIS — F03A18 Unspecified dementia, mild, with other behavioral disturbance: Secondary | ICD-10-CM | POA: Diagnosis present

## 2022-07-19 DIAGNOSIS — E876 Hypokalemia: Secondary | ICD-10-CM | POA: Diagnosis not present

## 2022-07-19 DIAGNOSIS — S2221XA Fracture of manubrium, initial encounter for closed fracture: Secondary | ICD-10-CM | POA: Diagnosis not present

## 2022-07-19 DIAGNOSIS — Z8672 Personal history of thrombophlebitis: Secondary | ICD-10-CM

## 2022-07-19 DIAGNOSIS — D72829 Elevated white blood cell count, unspecified: Secondary | ICD-10-CM | POA: Diagnosis not present

## 2022-07-19 DIAGNOSIS — Z7983 Long term (current) use of bisphosphonates: Secondary | ICD-10-CM | POA: Diagnosis not present

## 2022-07-19 DIAGNOSIS — R680 Hypothermia, not associated with low environmental temperature: Secondary | ICD-10-CM | POA: Diagnosis not present

## 2022-07-19 DIAGNOSIS — Z881 Allergy status to other antibiotic agents status: Secondary | ICD-10-CM

## 2022-07-19 DIAGNOSIS — W19XXXA Unspecified fall, initial encounter: Secondary | ICD-10-CM | POA: Diagnosis present

## 2022-07-19 DIAGNOSIS — T796XXA Traumatic ischemia of muscle, initial encounter: Secondary | ICD-10-CM | POA: Diagnosis not present

## 2022-07-19 DIAGNOSIS — G4700 Insomnia, unspecified: Secondary | ICD-10-CM | POA: Diagnosis not present

## 2022-07-19 DIAGNOSIS — E860 Dehydration: Secondary | ICD-10-CM | POA: Diagnosis present

## 2022-07-19 DIAGNOSIS — Z79899 Other long term (current) drug therapy: Secondary | ICD-10-CM

## 2022-07-19 DIAGNOSIS — R54 Age-related physical debility: Secondary | ICD-10-CM

## 2022-07-19 LAB — COMPREHENSIVE METABOLIC PANEL
ALT: 35 U/L (ref 0–44)
AST: 70 U/L — ABNORMAL HIGH (ref 15–41)
Albumin: 3.9 g/dL (ref 3.5–5.0)
Alkaline Phosphatase: 105 U/L (ref 38–126)
Anion gap: 13 (ref 5–15)
BUN: 25 mg/dL — ABNORMAL HIGH (ref 8–23)
CO2: 20 mmol/L — ABNORMAL LOW (ref 22–32)
Calcium: 8.8 mg/dL — ABNORMAL LOW (ref 8.9–10.3)
Chloride: 106 mmol/L (ref 98–111)
Creatinine, Ser: 0.7 mg/dL (ref 0.44–1.00)
GFR, Estimated: >60 mL/min (ref >60)
Glucose, Bld: 96 mg/dL (ref 70–99)
Potassium: 3.6 mmol/L (ref 3.5–5.1)
Sodium: 139 mmol/L (ref 135–145)
Total Bilirubin: 1.8 mg/dL — ABNORMAL HIGH (ref 0.3–1.2)
Total Protein: 7 g/dL (ref 6.5–8.1)

## 2022-07-19 LAB — LACTIC ACID, PLASMA
Lactic Acid, Venous: 1.7 mmol/L (ref 0.5–1.9)
Lactic Acid, Venous: 1.6 mmol/L (ref 0.5–1.9)

## 2022-07-19 LAB — CBC WITH DIFFERENTIAL/PLATELET
Abs Immature Granulocytes: 0.1 10*3/uL — ABNORMAL HIGH (ref 0.00–0.07)
Basophils Absolute: 0.1 10*3/uL (ref 0.0–0.1)
Basophils Relative: 0 %
Eosinophils Absolute: 0 10*3/uL (ref 0.0–0.5)
Eosinophils Relative: 0 %
HCT: 44.3 % (ref 36.0–46.0)
Hemoglobin: 14.5 g/dL (ref 12.0–15.0)
Immature Granulocytes: 1 %
Lymphocytes Relative: 6 %
Lymphs Abs: 1.1 10*3/uL (ref 0.7–4.0)
MCH: 31.4 pg (ref 26.0–34.0)
MCHC: 32.7 g/dL (ref 30.0–36.0)
MCV: 95.9 fL (ref 80.0–100.0)
Monocytes Absolute: 1.1 10*3/uL — ABNORMAL HIGH (ref 0.1–1.0)
Monocytes Relative: 6 %
Neutro Abs: 15.5 10*3/uL — ABNORMAL HIGH (ref 1.7–7.7)
Neutrophils Relative %: 87 %
Platelets: 247 10*3/uL (ref 150–400)
RBC: 4.62 MIL/uL (ref 3.87–5.11)
RDW: 14 % (ref 11.5–15.5)
WBC: 17.8 10*3/uL — ABNORMAL HIGH (ref 4.0–10.5)
nRBC: 0 % (ref 0.0–0.2)

## 2022-07-19 LAB — URINALYSIS, ROUTINE W REFLEX MICROSCOPIC
Bilirubin Urine: NEGATIVE
Glucose, UA: NEGATIVE mg/dL
Hgb urine dipstick: NEGATIVE
Ketones, ur: 80 mg/dL — AB
Leukocytes,Ua: NEGATIVE
Nitrite: NEGATIVE
Protein, ur: 100 mg/dL — AB
Specific Gravity, Urine: 1.019 (ref 1.005–1.030)
pH: 5 (ref 5.0–8.0)

## 2022-07-19 LAB — TROPONIN I (HIGH SENSITIVITY)
Troponin I (High Sensitivity): 67 ng/L — ABNORMAL HIGH (ref <18)
Troponin I (High Sensitivity): 80 ng/L — ABNORMAL HIGH (ref <18)

## 2022-07-19 LAB — PROTIME-INR
INR: 1.1 (ref 0.8–1.2)
Prothrombin Time: 13.7 seconds (ref 11.4–15.2)

## 2022-07-19 LAB — CK: Total CK: 1586 U/L — ABNORMAL HIGH (ref 38–234)

## 2022-07-19 MED ORDER — ENOXAPARIN SODIUM 40 MG/0.4ML IJ SOSY
40.0000 mg | PREFILLED_SYRINGE | INTRAMUSCULAR | Status: DC
  Administered 2022-07-20 – 2022-07-22 (×3): 40 mg via SUBCUTANEOUS
  Filled 2022-07-19 (×4): qty 0.4

## 2022-07-19 MED ORDER — FENTANYL CITRATE PF 50 MCG/ML IJ SOSY
50.0000 ug | PREFILLED_SYRINGE | Freq: Once | INTRAMUSCULAR | Status: AC
  Administered 2022-07-19: 50 ug via INTRAVENOUS
  Filled 2022-07-19: qty 1

## 2022-07-19 MED ORDER — IOHEXOL 300 MG/ML  SOLN
100.0000 mL | Freq: Once | INTRAMUSCULAR | Status: AC | PRN
  Administered 2022-07-19: 80 mL via INTRAVENOUS

## 2022-07-19 MED ORDER — ACETAMINOPHEN 500 MG PO TABS
1000.0000 mg | ORAL_TABLET | Freq: Once | ORAL | Status: AC
  Administered 2022-07-19: 1000 mg via ORAL
  Filled 2022-07-19: qty 2

## 2022-07-19 MED ORDER — LACTATED RINGERS IV BOLUS
1000.0000 mL | Freq: Once | INTRAVENOUS | Status: AC
  Administered 2022-07-19: 1000 mL via INTRAVENOUS

## 2022-07-19 MED ORDER — ACETAMINOPHEN 325 MG PO TABS: 650.0000 mg | ORAL_TABLET | Freq: Four times a day (QID) | ORAL | Status: DC | PRN

## 2022-07-19 MED ORDER — FENTANYL CITRATE PF 50 MCG/ML IJ SOSY: 50.0000 ug | PREFILLED_SYRINGE | INTRAMUSCULAR | Status: DC | PRN

## 2022-07-19 MED ORDER — LACTATED RINGERS IV SOLN: INTRAVENOUS | Status: DC

## 2022-07-19 MED ORDER — LIDOCAINE 5 % EX PTCH
1.0000 | MEDICATED_PATCH | CUTANEOUS | Status: DC
  Administered 2022-07-20 – 2022-07-22 (×3): 1 via TRANSDERMAL
  Filled 2022-07-19 (×4): qty 1

## 2022-07-19 MED ORDER — LACTATED RINGERS IV SOLN: INTRAVENOUS | Status: AC

## 2022-07-19 NOTE — Assessment & Plan Note (Signed)
67-->82 -Likely demand ischemia from  rhabdo and dehydration

## 2022-07-19 NOTE — ED Notes (Signed)
Pt and family educated on need for stay npo until after CT SCANS are read. Pt and family agreeable to plan

## 2022-07-19 NOTE — ED Notes (Signed)
Patient had difficulty with swallowing tylenol. Pills crushed and mixed with applesauce. Pt was able to swallow applesauce without issue. Pt also provided ham sandwich and able to feed self after meal set up.

## 2022-07-19 NOTE — ED Triage Notes (Signed)
Hx dementia, lives at home alone. Family states they didn't hear from her for 2 days, medic found down in her screened in porch. Pt states she fell in the bathroom. No bloodthinners. Pt c/o left side body pain. C-collar in place by medics.

## 2022-07-19 NOTE — ED Notes (Signed)
ED Provider at bedside. 

## 2022-07-19 NOTE — Assessment & Plan Note (Addendum)
CK 1586 -Secondary to fall and being found down a few days -Keep on continuous IV fluids overnight

## 2022-07-19 NOTE — Consult Note (Signed)
Reason for Consult: Trauma consult status post fall Referring Physician: Dr. Calla Kicks  Jillian Barker is an 89 y.o. female.  HPI: This is a 89 year old female who lives alone who fell on Friday.  She was not found until today by her granddaughter.  She has a history of falls.  She was taken to the ER.  She reported no loss of consciousness.  She complains of being sore.  She is found to have an elevated CK level.  The rest of her trauma workup was remarkable only for a nondisplaced right sixth rib fracture and a possible sternal manubrial fracture.  Trauma was asked to see in consultation.  The patient reports she is sore all over.  She denies headache.  She denies shortness of breath  Past Medical History:  Diagnosis Date   Allergy    Hypertension    Osteoporosis    Superficial thrombophlebitis     Past Surgical History:  Procedure Laterality Date   BIOPSY  06/23/2021   Procedure: BIOPSY;  Surgeon: Otis Brace, MD;  Location: [facility];  Service: Gastroenterology;;   ESOPHAGOGASTRODUODENOSCOPY (EGD) WITH PROPOFOL N/A 06/23/2021   Procedure: ESOPHAGOGASTRODUODENOSCOPY (EGD) WITH PROPOFOL;  Surgeon: Otis Brace, MD;  Location: [facility];  Service: Gastroenterology;  Laterality: N/A;   FOREIGN BODY REMOVAL  06/23/2021   Procedure: FOREIGN BODY REMOVAL;  Surgeon: Otis Brace, MD;  Location: [facility];  Service: Gastroenterology;;    No family history on file.  Social History:  reports that she has never smoked. She has never used smokeless tobacco. She reports that she does not drink alcohol and does not use drugs.  Allergies:  Allergies  Allergen Reactions   Penicillins Other (See Comments)    Pass out .Marland Kitchenas a child     Medications: I have reviewed the patient's current medications.  Results for orders placed or performed during the hospital encounter of 07/19/22 (from the past 48 hour(s))  Comprehensive metabolic panel     Status: Abnormal    Collection Time: 07/19/22  1:59 PM  Result Value Ref Range   Sodium 139 135 - 145 mmol/L   Potassium 3.6 3.5 - 5.1 mmol/L   Chloride 106 98 - 111 mmol/L   CO2 20 (L) 22 - 32 mmol/L   Glucose, Bld 96 70 - 99 mg/dL    Comment: Glucose reference range applies only to samples taken after fasting for at least 8 hours.   BUN 25 (H) 8 - 23 mg/dL   Creatinine, Ser 0.70 0.44 - 1.00 mg/dL   Calcium 8.8 (L) 8.9 - 10.3 mg/dL   Total Protein 7.0 6.5 - 8.1 g/dL   Albumin 3.9 3.5 - 5.0 g/dL   AST 70 (H) 15 - 41 U/L   ALT 35 0 - 44 U/L   Alkaline Phosphatase 105 38 - 126 U/L   Total Bilirubin 1.8 (H) 0.3 - 1.2 mg/dL   GFR, Estimated >60 >60 mL/min    Comment: (NOTE) Calculated using the CKD-EPI Creatinine Equation (2021)    Anion gap 13 5 - 15    Comment: Performed at [facility], [place] [address] [place], [place] [address]  CBC with Differential     Status: Abnormal   Collection Time: 07/19/22  1:59 PM  Result Value Ref Range   WBC 17.8 (H) 4.0 - 10.5 K/uL   RBC 4.62 3.87 - 5.11 MIL/uL   Hemoglobin 14.5 12.0 - 15.0 g/dL   HCT 44.3 36.0 - 46.0 %   MCV  95.9 80.0 - 100.0 fL   MCH 31.4 26.0 - 34.0 pg   MCHC 32.7 30.0 - 36.0 g/dL   RDW 14.0 11.5 - 15.5 %   Platelets 247 150 - 400 K/uL   nRBC 0.0 0.0 - 0.2 %   Neutrophils Relative % 87 %   Neutro Abs 15.5 (H) 1.7 - 7.7 K/uL   Lymphocytes Relative 6 %   Lymphs Abs 1.1 0.7 - 4.0 K/uL   Monocytes Relative 6 %   Monocytes Absolute 1.1 (H) 0.1 - 1.0 K/uL   Eosinophils Relative 0 %   Eosinophils Absolute 0.0 0.0 - 0.5 K/uL   Basophils Relative 0 %   Basophils Absolute 0.1 0.0 - 0.1 K/uL   Immature Granulocytes 1 %   Abs Immature Granulocytes 0.10 (H) 0.00 - 0.07 K/uL    Comment: Performed at [facility], [place] [address], [place], [place] [address]  Troponin I (High Sensitivity)     Status: Abnormal   Collection Time: 07/19/22  1:59 PM  Result Value Ref Range   Troponin I (High Sensitivity) 67 (H) <18  ng/L    Comment: (NOTE) Elevated high sensitivity troponin I (hsTnI) values and significant  changes across serial measurements may suggest ACS but many other  chronic and acute conditions are known to elevate hsTnI results.  Refer to the "Links" section for chest pain algorithms and additional  guidance. Performed at [facility], [place] [address] [place], [place] [address]   Lactic acid, plasma     Status: None   Collection Time: 07/19/22  2:00 PM  Result Value Ref Range   Lactic Acid, Venous 1.7 0.5 - 1.9 mmol/L    Comment: Performed at [facility], [place] [address] [place], [place] [address]  CK     Status: Abnormal   Collection Time: 07/19/22  2:26 PM  Result Value Ref Range   Total CK 1,586 (H) 38 - 234 U/L    Comment: Performed at [facility], [place] [address] [place], [place] [address]  Protime-INR     Status: None   Collection Time: 07/19/22  2:26 PM  Result Value Ref Range   Prothrombin Time 13.7 11.4 - 15.2 seconds   INR 1.1 0.8 - 1.2    Comment: (NOTE) INR goal varies based on device and disease states. Performed at [facility], [place] [address], [place], [place] [address]   Urinalysis, Routine w reflex microscopic -Urine, Clean Catch     Status: Abnormal   Collection Time: 07/19/22  4:05 PM  Result Value Ref Range   Color, Urine YELLOW YELLOW   APPearance CLEAR CLEAR   Specific Gravity, Urine 1.019 1.005 - 1.030   pH 5.0 5.0 - 8.0   Glucose, UA NEGATIVE NEGATIVE mg/dL   Hgb urine dipstick NEGATIVE NEGATIVE   Bilirubin Urine NEGATIVE NEGATIVE   Ketones, ur 80 (A) NEGATIVE mg/dL   Protein, ur 100 (A) NEGATIVE mg/dL   Nitrite NEGATIVE NEGATIVE   Leukocytes,Ua NEGATIVE NEGATIVE   RBC / HPF 0-5 0 - 5 RBC/hpf   WBC, UA 0-5 0 - 5 WBC/hpf   Bacteria, UA RARE (A) NONE SEEN   Squamous Epithelial / HPF 0-5 0 - 5 /HPF    Comment: Performed at [facility], [place] [address], [place], [place] [address]  Lactic acid, plasma     Status: None   Collection Time: 07/19/22  4:48 PM  Result Value Ref Range  Lactic Acid, Venous 1.6 0.5 - 1.9 mmol/L    Comment: Performed at [facility], [place] [address] [place], [place] [address]  Troponin I (High Sensitivity)     Status: Abnormal   Collection Time: 07/19/22  4:48 PM  Result Value Ref Range   Troponin I (High Sensitivity) 80 (H) <18 ng/L    Comment: (NOTE) Elevated high sensitivity troponin I (hsTnI) values and significant  changes across serial measurements may suggest ACS but many other  chronic and acute conditions are known to elevate hsTnI results.  Refer to the "Links" section for chest pain algorithms and additional  guidance. Performed at [facility], [place] [address] [place], [place] [address]     CT CHEST ABDOMEN PELVIS W CONTRAST  Result Date: 07/19/2022 CLINICAL DATA:  Trauma. EXAM: CT CHEST, ABDOMEN, AND PELVIS WITH CONTRAST TECHNIQUE: Multidetector CT imaging of the chest, abdomen and pelvis was performed following the standard protocol during bolus administration of intravenous contrast. RADIATION DOSE REDUCTION: This exam was performed according to the departmental dose-optimization program which includes automated exposure control, adjustment of the mA and/or kV according to patient size and/or use of iterative reconstruction technique. CONTRAST:  102mL OMNIPAQUE IOHEXOL 300 MG/ML  SOLN COMPARISON:  Chest radiograph 07/19/2022. FINDINGS: CT CHEST FINDINGS Cardiovascular: There is no cardiomegaly or pericardial effusion. Coronary vascular calcification of the LAD. Moderate atherosclerotic calcification of the thoracic aorta. No aneurysmal dilatation or dissection. The origins of the great vessels of the aortic arch and the central pulmonary arteries appear patent. Mediastinum/Nodes: No hilar or mediastinal adenopathy. Small hiatal hernia. The esophagus is grossly  unremarkable. No mediastinal fluid collection. Lungs/Pleura: Trace right pleural effusion. No focal consolidation or pneumothorax. The central airways are patent. Musculoskeletal: Minimally displaced fracture of the anterior right sixth rib. Osteopenia with degenerative changes of the spine. Age indeterminate, possibly acute fracture of the sternal manubrium. Correlation with point tenderness recommended. Sclerotic lesion in the proximal right humerus without aggressive features, likely an enchondroma. CT ABDOMEN PELVIS FINDINGS No intra-abdominal free air or free fluid. Hepatobiliary: The liver is unremarkable. No biliary dilatation. The gallbladder is unremarkable. Pancreas: Unremarkable. No pancreatic ductal dilatation or surrounding inflammatory changes. Spleen: Normal in size without focal abnormality. Adrenals/Urinary Tract: The adrenal glands are unremarkable. Bilateral renal cysts measure up to 5 cm in the inferior pole of the right kidney. There is no hydronephrosis on either side. There is symmetric enhancement and excretion of contrast by both kidneys. The urinary bladder is grossly unremarkable. Stomach/Bowel: Moderate stool throughout the colon. There is no bowel obstruction or active inflammation. The appendix is normal. Vascular/Lymphatic: Advanced aortoiliac atherosclerotic disease. The IVC is unremarkable. No portal venous gas. There is no adenopathy. Reproductive: The uterus and ovaries are grossly unremarkable. Other: None Musculoskeletal: Osteopenia with degenerative changes of the spine. Grade 1 L5-S1 anterolisthesis. No acute osseous pathology. IMPRESSION: 1. Minimally displaced fracture of the anterior right sixth rib. 2. Age indeterminate, possibly acute fracture of the sternal manubrium. Correlation with point tenderness recommended. 3. Trace right pleural effusion. 4. No acute/traumatic intra-abdominal or pelvic pathology. 5. Moderate colonic stool burden. No bowel obstruction. Normal  appendix. 6.  Aortic Atherosclerosis (ICD10-I70.0). Electronically Signed   By: Anner Crete M.D.   On: 07/19/2022 18:54   CT Head Wo Contrast  Result Date: 07/19/2022 CLINICAL DATA:  Head trauma, minor (Age >= 65y); Neck trauma (Age >= 65y) EXAM: CT HEAD WITHOUT CONTRAST CT CERVICAL SPINE WITHOUT CONTRAST TECHNIQUE: Multidetector CT imaging of the head  and cervical spine was performed following the standard protocol without intravenous contrast. Multiplanar CT image reconstructions of the cervical spine were also generated. RADIATION DOSE REDUCTION: This exam was performed according to the departmental dose-optimization program which includes automated exposure control, adjustment of the mA and/or kV according to patient size and/or use of iterative reconstruction technique. COMPARISON:  04/18/2020 FINDINGS: CT HEAD FINDINGS Brain: No evidence of acute infarction, hemorrhage, hydrocephalus, extra-axial collection or mass lesion/mass effect. Scattered low-density changes within the periventricular and subcortical white matter most compatible with chronic microvascular ischemic change. Mild diffuse cerebral volume loss. Vascular: Atherosclerotic calcifications involving the large vessels of the skull base. No unexpected hyperdense vessel. Skull: Normal. Negative for fracture or focal lesion. Sinuses/Orbits: No acute finding. Other: Negative for scalp hematoma. CT CERVICAL SPINE FINDINGS Alignment: Facet joints are aligned without dislocation or traumatic listhesis. Dens and lateral masses are aligned. Mildly exaggerated cervical lordosis. Degenerative grade 1 anterolisthesis of C4 on C5, C6 on C7, and C7 on T1. Skull base and vertebrae: No acute fracture. No primary bone lesion or focal pathologic process. Soft tissues and spinal canal: No prevertebral fluid or swelling. No visible canal hematoma. Disc levels:  Advanced multilevel cervical spondylosis. Upper chest: Negative. Other: Bilateral carotid  atherosclerosis. IMPRESSION: 1. No acute intracranial abnormality. 2. No acute fracture or traumatic listhesis of the cervical spine. Electronically Signed   By: Davina Poke D.O.   On: 07/19/2022 16:12   CT Cervical Spine Wo Contrast  Result Date: 07/19/2022 CLINICAL DATA:  Head trauma, minor (Age >= 65y); Neck trauma (Age >= 65y) EXAM: CT HEAD WITHOUT CONTRAST CT CERVICAL SPINE WITHOUT CONTRAST TECHNIQUE: Multidetector CT imaging of the head and cervical spine was performed following the standard protocol without intravenous contrast. Multiplanar CT image reconstructions of the cervical spine were also generated. RADIATION DOSE REDUCTION: This exam was performed according to the departmental dose-optimization program which includes automated exposure control, adjustment of the mA and/or kV according to patient size and/or use of iterative reconstruction technique. COMPARISON:  04/18/2020 FINDINGS: CT HEAD FINDINGS Brain: No evidence of acute infarction, hemorrhage, hydrocephalus, extra-axial collection or mass lesion/mass effect. Scattered low-density changes within the periventricular and subcortical white matter most compatible with chronic microvascular ischemic change. Mild diffuse cerebral volume loss. Vascular: Atherosclerotic calcifications involving the large vessels of the skull base. No unexpected hyperdense vessel. Skull: Normal. Negative for fracture or focal lesion. Sinuses/Orbits: No acute finding. Other: Negative for scalp hematoma. CT CERVICAL SPINE FINDINGS Alignment: Facet joints are aligned without dislocation or traumatic listhesis. Dens and lateral masses are aligned. Mildly exaggerated cervical lordosis. Degenerative grade 1 anterolisthesis of C4 on C5, C6 on C7, and C7 on T1. Skull base and vertebrae: No acute fracture. No primary bone lesion or focal pathologic process. Soft tissues and spinal canal: No prevertebral fluid or swelling. No visible canal hematoma. Disc levels:   Advanced multilevel cervical spondylosis. Upper chest: Negative. Other: Bilateral carotid atherosclerosis. IMPRESSION: 1. No acute intracranial abnormality. 2. No acute fracture or traumatic listhesis of the cervical spine. Electronically Signed   By: Davina Poke D.O.   On: 07/19/2022 16:12   DG Chest Port 1 View  Result Date: 07/19/2022 CLINICAL DATA:  fall EXAM: PORTABLE CHEST - 1 VIEW COMPARISON:  06/22/2021 FINDINGS: Lungs are clear. Heart size and mediastinal contours are within normal limits. Aortic Atherosclerosis (ICD10-170.0). No effusion. Visualized bones unremarkable. IMPRESSION: No acute cardiopulmonary disease. Electronically Signed   By: Lucrezia Europe M.D.   On: 07/19/2022 15:34  DG Knee 2 Views Left  Result Date: 07/19/2022 CLINICAL DATA:  fall EXAM: LEFT KNEE - 1-2 VIEW COMPARISON:  None Available. FINDINGS: No fracture or dislocation. There is a small effusion in the suprapatellar bursa with scattered calcifications. Marginal spurs from the patella, medial femoral condyle and tibial plateau. Chondrocalcinosis in the lateral compartment. Mild osteopenia. Patchy femoral-popliteal arterial calcifications. IMPRESSION: 1. No fracture . 2. Knee effusion with mild degenerative changes. Electronically Signed   By: Lucrezia Europe M.D.   On: 07/19/2022 15:34   DG Knee 2 Views Right  Result Date: 07/19/2022 CLINICAL DATA:  fall EXAM: RIGHT KNEE - 1-2 VIEW COMPARISON:  07/04/2016 FINDINGS: No fracture, dislocation, or effusion. Progressive tricompartmental DJD. Patchy femoral-popliteal arterial calcifications. Regional soft tissues unremarkable. IMPRESSION: Progressive tricompartmental DJD. No acute findings. Electronically Signed   By: Lucrezia Europe M.D.   On: 07/19/2022 15:32   DG Lumbar Spine 2-3 Views  Result Date: 07/19/2022 CLINICAL DATA:  Fall, pain EXAM: LUMBAR SPINE - 3 VIEW COMPARISON:  None Available. FINDINGS: Osseous structures are osteopenic. There is loss of disc space and marginal  osteophytes at each thoracic lumbar level consistent with degenerative disc disease. Dense atheromatous calcifications of the aorta. Mild compression deformity L1 should be considered age indeterminate. IMPRESSION: Osteopenia. Degenerative changes. Age-indeterminate mild compression deformity L1. Electronically Signed   By: Sammie Bench M.D.   On: 07/19/2022 15:31   DG Pelvis Portable  Result Date: 07/19/2022 CLINICAL DATA:  fall EXAM: PORTABLE PELVIS 1-2 VIEWS COMPARISON:  None Available. FINDINGS: There is no evidence of pelvic fracture or diastasis. No pelvic bone lesions are seen. Mild right hip DJD. Patchy iliofemoral arterial calcifications. Lumbar levoscoliosis with multilevel spondylitic change noted. IMPRESSION: No acute findings. Mild right hip DJD. Electronically Signed   By: Lucrezia Europe M.D.   On: 07/19/2022 15:31    Review of Systems  All other systems reviewed and are negative.  Blood pressure 137/60, pulse 94, temperature 97.6 F (36.4 C), temperature source Rectal, resp. rate 20, height 4\' 11"  (1.499 m), weight 60 kg, SpO2 96 %. Physical Exam Constitutional:      General: She is not in acute distress.    Appearance: Normal appearance.  HENT:     Head: Normocephalic and atraumatic.     Right Ear: External ear normal.     Left Ear: External ear normal.     Nose: Nose normal.  Eyes:     General: No scleral icterus.    Pupils: Pupils are equal, round, and reactive to light.  Cardiovascular:     Rate and Rhythm: Normal rate and regular rhythm.  Pulmonary:     Effort: Pulmonary effort is normal.     Breath sounds: Normal breath sounds.  Abdominal:     General: Abdomen is flat.     Palpations: Abdomen is soft.     Tenderness: There is no abdominal tenderness.  Musculoskeletal:        General: Tenderness present.     Cervical back: Normal range of motion and neck supple. No tenderness.     Comments: There is some tenderness and swelling at the knees bilaterally with  ecchymosis just below the knees.    Skin:    General: Skin is warm and dry.  Neurological:     General: No focal deficit present.     Mental Status: She is alert and oriented to person, place, and time. Mental status is at baseline.  Psychiatric:        Behavior:  Behavior normal.        Thought Content: Thought content normal.     Assessment/Plan: Patient status post fall almost 48 hours ago with a right sixth nondisplaced rib fracture with trace pleural effusion and no pneumothorax and a sternal manubrial fracture. She has elevated CK levels  At this point, there is nothing that would require surgical intervention.  I have reviewed the CT scan of her head, C-spine, chest, abdomen, and pelvis which are otherwise unremarkable except for the above-mentioned injuries.  X-rays of her bilateral lower extremities are also negative for acute injury.  Given her advanced age and elevated CK level, I recommend admission to the hospitalist service at [facility] with the trauma service following in consultation.  She will need to be transferred to the [facility] emergency department if no beds are available as the trauma service will need to be involved with her care.  She will likely require physical therapy to evaluate her and may require rehab or a SNF.  Moderate medical decision making  Coralie Keens MD 07/19/2022, 8:01 PM

## 2022-07-19 NOTE — ED Provider Notes (Signed)
[place] EMERGENCY DEPARTMENT AT [facility] Provider Note   CSN: [id number] Arrival date & time: 07/19/22  1337     History  Chief Complaint  Patient presents with   Jillian Barker is a 89 y.o. female.  HPI Patient is brought from home after a fall.  She lives independently but her granddaughter checks on her frequently.  Patient does have mild dementia but is independent at home.  Patient reports she fell but she cannot remember exactly when.  Patient's granddaughter reports she is pretty sure it was Friday night, a day and a half ago.  Patient's granddaughter reports that she visited her Friday evening and when she checked in on her today the patient had not gotten her newspaper from Saturday morning or Sunday morning.  She was found on a screened in porch.  Patient reports that she fell and she could not get back up.  She reports most of her pain is in her buttocks.  Patient reports she feels very thirsty and her mouth is dry.  She reports she was very cold but does not have other specific complaint.  Patient's granddaughter reports that the patient had a fall about a week and a half ago and at that time had some bruising and pain pre-existing to the right knee.    Home Medications Prior to Admission medications   Medication Sig Start Date End Date Taking? Authorizing Provider  alendronate (FOSAMAX) 70 MG tablet Take 70 mg by mouth once a week. 05/09/16   [provider]  CALCIUM-VITAMIN D PO Take 1 tablet by mouth daily.    [provider]  erythromycin [facility]) ophthalmic ointment Place 1 application into the left eye 3 (three) times daily. 08/11/16   Wendie Agreste, MD  ibuprofen (ADVIL,MOTRIN) 200 MG tablet Take 400 mg by mouth every 6 (six) hours as needed.    [provider]  pantoprazole (PROTONIX) 40 MG tablet Take 1 tablet (40 mg total) by mouth 2 (two) times daily. 06/23/21 06/23/22  Otis Brace, MD   triamterene-hydrochlorothiazide (MAXZIDE-25) 37.5-25 MG per tablet Take 1 tablet by mouth daily.    [provider]      Allergies    Penicillins    Review of Systems   Review of Systems  Physical Exam Updated Vital Signs BP 137/60 (BP Location: Right Arm)   Pulse 94   Temp 97.6 F (36.4 C) (Rectal)   Resp 20   Ht 4\' 11"  (1.499 m)   Wt 60 kg   SpO2 96%   BMI 26.72 kg/m  Physical Exam Constitutional:      Comments: Patient is alert.  Well-nourished well-developed for age but slightly frail.  No respiratory distress.  HENT:     Head: Normocephalic and atraumatic.     Mouth/Throat:     Mouth: Mucous membranes are dry.     Pharynx: Oropharynx is clear.  Eyes:     Extraocular Movements: Extraocular movements intact.     Pupils: Pupils are equal, round, and reactive to light.  Neck:     Comments: Cervical collar in place maintained. Cardiovascular:     Rate and Rhythm: Normal rate and regular rhythm.  Pulmonary:     Effort: Pulmonary effort is normal.     Breath sounds: Normal breath sounds.  Chest:     Chest wall: No tenderness.  Abdominal:     General: There is no distension.     Palpations: Abdomen is soft.  Tenderness: There is no abdominal tenderness. There is no guarding.     Comments: Linear purple bruise close to the iliac crest on the right.  About 10 cm in length and 1 cm width.  Musculoskeletal:     Comments: No deformities of the upper extremities.  Moderate swelling and ecchymoses of the right knee.  She endorses some discomfort to flexion at the hip and knee on the left but it is intact without evident deformity or limited range of motion.  Both feet are warm and dry and symmetric.  No peripheral edema of the feet or the ankles.  Mild edema of the pretibial area with skin thinning.  Skin:    General: Skin is warm and dry.     Coloration: Skin is pale.  Neurological:     Comments: Patient is awake and alert.  She is not somnolent.  She is  answering questions has some limitations to recall but is situationally aware of what happened and general timeframes.  No focal motor deficits.  Patient follows commands for grip strength bilateral upper extremities.,  She will flex against resistance for both feet.  Psychiatric:        Mood and Affect: Mood normal.     Comments: Patient is calm and appropriate answering questions and responding to commands.     ED Results / Procedures / Treatments   Labs (all labs ordered are listed, but only abnormal results are displayed) Labs Reviewed  COMPREHENSIVE METABOLIC PANEL - Abnormal; Notable for the following components:      Result Value   CO2 20 (*)    BUN 25 (*)    Calcium 8.8 (*)    AST 70 (*)    Total Bilirubin 1.8 (*)    All other components within normal limits  CBC WITH DIFFERENTIAL/PLATELET - Abnormal; Notable for the following components:   WBC 17.8 (*)    Neutro Abs 15.5 (*)    Monocytes Absolute 1.1 (*)    Abs Immature Granulocytes 0.10 (*)    All other components within normal limits  URINALYSIS, ROUTINE W REFLEX MICROSCOPIC - Abnormal; Notable for the following components:   Ketones, ur 80 (*)    Protein, ur 100 (*)    Bacteria, UA RARE (*)    All other components within normal limits  CK - Abnormal; Notable for the following components:   Total CK 1,586 (*)    All other components within normal limits  TROPONIN I (HIGH SENSITIVITY) - Abnormal; Notable for the following components:   Troponin I (High Sensitivity) 67 (*)    All other components within normal limits  TROPONIN I (HIGH SENSITIVITY) - Abnormal; Notable for the following components:   Troponin I (High Sensitivity) 80 (*)    All other components within normal limits  LACTIC ACID, PLASMA  LACTIC ACID, PLASMA  PROTIME-INR    EKG EKG Interpretation  Date/Time:  Sunday July 19 2022 14:00:32 EDT Ventricular Rate:  83 PR Interval:    QRS Duration: 99 QT Interval:  430 QTC Calculation: 506 R  Axis:   -2 Text Interpretation: duplicate Confirmed by Charlesetta Shanks [id number]) on 07/19/2022 2:46:09 PM  Radiology CT CHEST ABDOMEN PELVIS W CONTRAST  Result Date: 07/19/2022 CLINICAL DATA:  Trauma. EXAM: CT CHEST, ABDOMEN, AND PELVIS WITH CONTRAST TECHNIQUE: Multidetector CT imaging of the chest, abdomen and pelvis was performed following the standard protocol during bolus administration of intravenous contrast. RADIATION DOSE REDUCTION: This exam was performed according to the departmental dose-optimization program  which includes automated exposure control, adjustment of the mA and/or kV according to patient size and/or use of iterative reconstruction technique. CONTRAST:  75mL OMNIPAQUE IOHEXOL 300 MG/ML  SOLN COMPARISON:  Chest radiograph 07/19/2022. FINDINGS: CT CHEST FINDINGS Cardiovascular: There is no cardiomegaly or pericardial effusion. Coronary vascular calcification of the LAD. Moderate atherosclerotic calcification of the thoracic aorta. No aneurysmal dilatation or dissection. The origins of the great vessels of the aortic arch and the central pulmonary arteries appear patent. Mediastinum/Nodes: No hilar or mediastinal adenopathy. Small hiatal hernia. The esophagus is grossly unremarkable. No mediastinal fluid collection. Lungs/Pleura: Trace right pleural effusion. No focal consolidation or pneumothorax. The central airways are patent. Musculoskeletal: Minimally displaced fracture of the anterior right sixth rib. Osteopenia with degenerative changes of the spine. Age indeterminate, possibly acute fracture of the sternal manubrium. Correlation with point tenderness recommended. Sclerotic lesion in the proximal right humerus without aggressive features, likely an enchondroma. CT ABDOMEN PELVIS FINDINGS No intra-abdominal free air or free fluid. Hepatobiliary: The liver is unremarkable. No biliary dilatation. The gallbladder is unremarkable. Pancreas: Unremarkable. No pancreatic ductal dilatation or  surrounding inflammatory changes. Spleen: Normal in size without focal abnormality. Adrenals/Urinary Tract: The adrenal glands are unremarkable. Bilateral renal cysts measure up to 5 cm in the inferior pole of the right kidney. There is no hydronephrosis on either side. There is symmetric enhancement and excretion of contrast by both kidneys. The urinary bladder is grossly unremarkable. Stomach/Bowel: Moderate stool throughout the colon. There is no bowel obstruction or active inflammation. The appendix is normal. Vascular/Lymphatic: Advanced aortoiliac atherosclerotic disease. The IVC is unremarkable. No portal venous gas. There is no adenopathy. Reproductive: The uterus and ovaries are grossly unremarkable. Other: None Musculoskeletal: Osteopenia with degenerative changes of the spine. Grade 1 L5-S1 anterolisthesis. No acute osseous pathology. IMPRESSION: 1. Minimally displaced fracture of the anterior right sixth rib. 2. Age indeterminate, possibly acute fracture of the sternal manubrium. Correlation with point tenderness recommended. 3. Trace right pleural effusion. 4. No acute/traumatic intra-abdominal or pelvic pathology. 5. Moderate colonic stool burden. No bowel obstruction. Normal appendix. 6.  Aortic Atherosclerosis (ICD10-I70.0). Electronically Signed   By: Anner Crete M.D.   On: 07/19/2022 18:54   CT Head Wo Contrast  Result Date: 07/19/2022 CLINICAL DATA:  Head trauma, minor (Age >= 65y); Neck trauma (Age >= 65y) EXAM: CT HEAD WITHOUT CONTRAST CT CERVICAL SPINE WITHOUT CONTRAST TECHNIQUE: Multidetector CT imaging of the head and cervical spine was performed following the standard protocol without intravenous contrast. Multiplanar CT image reconstructions of the cervical spine were also generated. RADIATION DOSE REDUCTION: This exam was performed according to the departmental dose-optimization program which includes automated exposure control, adjustment of the mA and/or kV according to patient  size and/or use of iterative reconstruction technique. COMPARISON:  04/18/2020 FINDINGS: CT HEAD FINDINGS Brain: No evidence of acute infarction, hemorrhage, hydrocephalus, extra-axial collection or mass lesion/mass effect. Scattered low-density changes within the periventricular and subcortical white matter most compatible with chronic microvascular ischemic change. Mild diffuse cerebral volume loss. Vascular: Atherosclerotic calcifications involving the large vessels of the skull base. No unexpected hyperdense vessel. Skull: Normal. Negative for fracture or focal lesion. Sinuses/Orbits: No acute finding. Other: Negative for scalp hematoma. CT CERVICAL SPINE FINDINGS Alignment: Facet joints are aligned without dislocation or traumatic listhesis. Dens and lateral masses are aligned. Mildly exaggerated cervical lordosis. Degenerative grade 1 anterolisthesis of C4 on C5, C6 on C7, and C7 on T1. Skull base and vertebrae: No acute fracture. No primary bone lesion or  focal pathologic process. Soft tissues and spinal canal: No prevertebral fluid or swelling. No visible canal hematoma. Disc levels:  Advanced multilevel cervical spondylosis. Upper chest: Negative. Other: Bilateral carotid atherosclerosis. IMPRESSION: 1. No acute intracranial abnormality. 2. No acute fracture or traumatic listhesis of the cervical spine. Electronically Signed   By: Davina Poke D.O.   On: 07/19/2022 16:12   CT Cervical Spine Wo Contrast  Result Date: 07/19/2022 CLINICAL DATA:  Head trauma, minor (Age >= 65y); Neck trauma (Age >= 65y) EXAM: CT HEAD WITHOUT CONTRAST CT CERVICAL SPINE WITHOUT CONTRAST TECHNIQUE: Multidetector CT imaging of the head and cervical spine was performed following the standard protocol without intravenous contrast. Multiplanar CT image reconstructions of the cervical spine were also generated. RADIATION DOSE REDUCTION: This exam was performed according to the departmental dose-optimization program which  includes automated exposure control, adjustment of the mA and/or kV according to patient size and/or use of iterative reconstruction technique. COMPARISON:  04/18/2020 FINDINGS: CT HEAD FINDINGS Brain: No evidence of acute infarction, hemorrhage, hydrocephalus, extra-axial collection or mass lesion/mass effect. Scattered low-density changes within the periventricular and subcortical white matter most compatible with chronic microvascular ischemic change. Mild diffuse cerebral volume loss. Vascular: Atherosclerotic calcifications involving the large vessels of the skull base. No unexpected hyperdense vessel. Skull: Normal. Negative for fracture or focal lesion. Sinuses/Orbits: No acute finding. Other: Negative for scalp hematoma. CT CERVICAL SPINE FINDINGS Alignment: Facet joints are aligned without dislocation or traumatic listhesis. Dens and lateral masses are aligned. Mildly exaggerated cervical lordosis. Degenerative grade 1 anterolisthesis of C4 on C5, C6 on C7, and C7 on T1. Skull base and vertebrae: No acute fracture. No primary bone lesion or focal pathologic process. Soft tissues and spinal canal: No prevertebral fluid or swelling. No visible canal hematoma. Disc levels:  Advanced multilevel cervical spondylosis. Upper chest: Negative. Other: Bilateral carotid atherosclerosis. IMPRESSION: 1. No acute intracranial abnormality. 2. No acute fracture or traumatic listhesis of the cervical spine. Electronically Signed   By: Davina Poke D.O.   On: 07/19/2022 16:12   DG Chest Port 1 View  Result Date: 07/19/2022 CLINICAL DATA:  fall EXAM: PORTABLE CHEST - 1 VIEW COMPARISON:  06/22/2021 FINDINGS: Lungs are clear. Heart size and mediastinal contours are within normal limits. Aortic Atherosclerosis (ICD10-170.0). No effusion. Visualized bones unremarkable. IMPRESSION: No acute cardiopulmonary disease. Electronically Signed   By: Lucrezia Europe M.D.   On: 07/19/2022 15:34   DG Knee 2 Views Left  Result Date:  07/19/2022 CLINICAL DATA:  fall EXAM: LEFT KNEE - 1-2 VIEW COMPARISON:  None Available. FINDINGS: No fracture or dislocation. There is a small effusion in the suprapatellar bursa with scattered calcifications. Marginal spurs from the patella, medial femoral condyle and tibial plateau. Chondrocalcinosis in the lateral compartment. Mild osteopenia. Patchy femoral-popliteal arterial calcifications. IMPRESSION: 1. No fracture . 2. Knee effusion with mild degenerative changes. Electronically Signed   By: Lucrezia Europe M.D.   On: 07/19/2022 15:34   DG Knee 2 Views Right  Result Date: 07/19/2022 CLINICAL DATA:  fall EXAM: RIGHT KNEE - 1-2 VIEW COMPARISON:  07/04/2016 FINDINGS: No fracture, dislocation, or effusion. Progressive tricompartmental DJD. Patchy femoral-popliteal arterial calcifications. Regional soft tissues unremarkable. IMPRESSION: Progressive tricompartmental DJD. No acute findings. Electronically Signed   By: Lucrezia Europe M.D.   On: 07/19/2022 15:32   DG Lumbar Spine 2-3 Views  Result Date: 07/19/2022 CLINICAL DATA:  Fall, pain EXAM: LUMBAR SPINE - 3 VIEW COMPARISON:  None Available. FINDINGS: Osseous structures are osteopenic. There is  loss of disc space and marginal osteophytes at each thoracic lumbar level consistent with degenerative disc disease. Dense atheromatous calcifications of the aorta. Mild compression deformity L1 should be considered age indeterminate. IMPRESSION: Osteopenia. Degenerative changes. Age-indeterminate mild compression deformity L1. Electronically Signed   By: Sammie Bench M.D.   On: 07/19/2022 15:31   DG Pelvis Portable  Result Date: 07/19/2022 CLINICAL DATA:  fall EXAM: PORTABLE PELVIS 1-2 VIEWS COMPARISON:  None Available. FINDINGS: There is no evidence of pelvic fracture or diastasis. No pelvic bone lesions are seen. Mild right hip DJD. Patchy iliofemoral arterial calcifications. Lumbar levoscoliosis with multilevel spondylitic change noted. IMPRESSION: No acute  findings. Mild right hip DJD. Electronically Signed   By: Lucrezia Europe M.D.   On: 07/19/2022 15:31    Procedures Procedures    Medications Ordered in ED Medications  lactated ringers infusion ( Intravenous New Bag/Given 07/19/22 1829)  fentaNYL (SUBLIMAZE) injection 50 mcg (has no administration in time range)  acetaminophen (TYLENOL) tablet 1,000 mg (has no administration in time range)  lactated ringers bolus 1,000 mL (0 mLs Intravenous Stopped 07/19/22 1648)  fentaNYL (SUBLIMAZE) injection 50 mcg (50 mcg Intravenous Given 07/19/22 1622)  iohexol (OMNIPAQUE) 300 MG/ML solution 100 mL (80 mLs Intravenous Contrast Given 07/19/22 1754)    ED Course/ Medical Decision Making/ A&P                             Medical Decision Making Amount and/or Complexity of Data Reviewed Labs: ordered. Radiology: ordered.  Risk OTC drugs. Prescription drug management. Decision regarding hospitalization.   Patient had an unwitnessed fall at home.  She had prolonged downtime approximately 36 hours to 48 hours by history.  Patient's mental status is alert.  She is not somnolent.  At baseline she has mild dementia and does not seem overtly confused at this time.  She is hypothermic.  Patient spent the time on a screen to porch and temperatures have been low at night.  Patient is currently under a [employer].  By exam she is clinically dehydrated.  Will proceed with CT scan of the head and C-spine for unwitnessed fall, x-rays of chest and pelvis and knees for fall.  Will initiate fluid resuscitation with lactated Ringer's.  At this time patient's main area of pain seems to be pelvis possibly low back or hips although no obvious deformities.  She does not appear to need pain medication at rest at this time.  Reassess for pain as rehydrating and resuscitation.  CT scan reviewed by radiology head and neck no acute intracranial injury or cervical spine fracture.  CT chest abdomen pelvis interpreted by radiology  and nondisplaced right rib fracture questionable sternal fracture and trace pleural effusion on the right.  CK 1586.  White count 17,000 H&H 14 and 44.  Urinalysis negative for white cells or WBCs.  Positive for ketones.  Consult: Reviewed with Dr. Ninfa Linden general surgery for advanced age patient with fall and prolonged downtime nondisplaced rib fracture.  He has performed consult and recommends hospitalist observation at [facility] with consultation to trauma surgery.  Patient mains alert.  She does not exhibit respiratory distress.  Vital signs are stable.  Will continue hydration.  She was hypothermic on arrival but now temperature has corrected.  Doubt sepsis.  No source of infection at this time.  Likely hypothermia secondary to out side exposure and elevation in CK with prolonged downtime and dehydration.  Will continue  overnight observation with fluids and pain control.        Final Clinical Impression(s) / ED Diagnoses Final diagnoses:  Fall, initial encounter  Dehydration  Closed fracture of one rib of right side, initial encounter  Advanced age  Hypothermia, initial encounter    Rx / DC Orders ED Discharge Orders     None         Charlesetta Shanks, MD 07/19/22 2049

## 2022-07-19 NOTE — ED Notes (Signed)
Patient transported to CT 

## 2022-07-19 NOTE — ED Notes (Signed)
C-collar removed per md pfeiffer

## 2022-07-19 NOTE — Assessment & Plan Note (Signed)
-   Stable.  Not on any antihypertensives at home.

## 2022-07-19 NOTE — ED Notes (Signed)
[facility] called for transportation to [facility].

## 2022-07-19 NOTE — ED Notes (Signed)
Pt placed on bair hugger

## 2022-07-19 NOTE — ED Notes (Signed)
Back from xray. Bair hugger placed back on pt

## 2022-07-19 NOTE — H&P (Signed)
History and Physical    Patient: Jillian Barker [id number] DOB: 1928-06-23 DOA: 07/19/2022 DOS: the patient was seen and examined on 07/19/2022 PCP: Pa, [facility]  Patient coming from: Home  Chief Complaint:  Chief Complaint  Patient presents with   Fall   HPI: Jillian Barker is a 89 y.o. female with medical history significant of HTN, osteoporosis who presents after a fall and found down since Friday.   Pt is a poor historian. She lives alone and ambulates independently. Says she has frequent falls. She was letting her dog out and then somehow fell on her bottom. Can't remember exactly why or even what day. Does not think she was dizziness or lightheaded.  I spoke with granddaughter who is her caretaker over the phone. She last saw pt 2 days ago on Friday and she was otherwise in her normal state of health. Checked on her today and found her down on the ground but conscious. Had soiled herself. Grand-daughter saw 2 newspapers outside and pt gets the paper daily so grand-daughter thinks for sure she was down for 2 days. Last had a fall a week before that. However her falls are a rare occurrence. Family just got her a life alert a few days ago but pt reluctant to wear it.   In the ED, she was hypothermic with temperature of 95 F, BP of 153/65. Mostly complaining of knee pain.  Had leukocytosis of 17.8, hemoglobin stable at 14.5.  No significant electrolyte abnormalities with normal creatinine.  Troponin elevated at 67 and 80.  CK was elevated to 1586.  CT head, C-spine is negative.  Pelvic, L-spine, left and right knee x-ray was negative for acute findings.  CT chest/abdomen/pelvis showed nondisplaced anterior right sixth rib fracture with possible sternal manubrial fracture.  General surgery Dr. Ninfa Linden evaluated patient and no surgical intervention was recommended.  However he recommends that she be transferred to [facility] and be evaluated by trauma  service in consultation.   Review of Systems: As mentioned in the history of present illness. All other systems reviewed and are negative. Past Medical History:  Diagnosis Date   Allergy    Hypertension    Osteoporosis    Superficial thrombophlebitis    Past Surgical History:  Procedure Laterality Date   BIOPSY  06/23/2021   Procedure: BIOPSY;  Surgeon: Otis Brace, MD;  Location: [facility];  Service: Gastroenterology;;   ESOPHAGOGASTRODUODENOSCOPY (EGD) WITH PROPOFOL N/A 06/23/2021   Procedure: ESOPHAGOGASTRODUODENOSCOPY (EGD) WITH PROPOFOL;  Surgeon: Otis Brace, MD;  Location: [facility];  Service: Gastroenterology;  Laterality: N/A;   FOREIGN BODY REMOVAL  06/23/2021   Procedure: FOREIGN BODY REMOVAL;  Surgeon: Otis Brace, MD;  Location: [facility];  Service: Gastroenterology;;   Social History:  reports that she has never smoked. She has never used smokeless tobacco. She reports that she does not drink alcohol and does not use drugs.  Allergies  Allergen Reactions   Penicillins Other (See Comments)    Pass out .Marland Kitchenas a child     No pertinent family history  Prior to Admission medications   Medication Sig Start Date End Date Taking? Authorizing Provider  ibuprofen (ADVIL,MOTRIN) 200 MG tablet Take 400 mg by mouth every 6 (six) hours as needed for headache or mild pain.   Yes [provider]  triamterene-hydrochlorothiazide (MAXZIDE-25) 37.5-25 MG per tablet Take 1 tablet by mouth daily.   Yes [provider]  alendronate (FOSAMAX) 70 MG tablet Take 70 mg by mouth  once a week. 05/09/16   [provider]  CALCIUM-VITAMIN D PO Take 1 tablet by mouth daily.    [provider]  erythromycin [facility]) ophthalmic ointment Place 1 application into the left eye 3 (three) times daily. 08/11/16   Wendie Agreste, MD  pantoprazole (PROTONIX) 40 MG tablet Take 1 tablet (40 mg total) by mouth 2 (two) times daily. 06/23/21 06/23/22   Otis Brace, MD    Physical Exam: Vitals:   07/19/22 1730 07/19/22 1737 07/19/22 1830 07/19/22 1855  BP: 132/61   137/60  Pulse: 79 93  94  Resp: (!) 21 15  20   Temp:   97.6 F (36.4 C)   TempSrc:   Rectal   SpO2: (!) 85% 97%  96%  Weight:      Height:       Constitutional: NAD, calm, comfortable, thin elderly female appearing stated age laying in bed.  Has hearing impairment. Eyes: lids and conjunctivae normal ENMT: Mucous membranes are dry Neck: normal, supple Respiratory: clear to auscultation bilaterally, no wheezing, no crackles. Normal respiratory effort. No accessory muscle use.  Cardiovascular: Regular rate and rhythm, no murmurs / rubs / gallops. No extremity edema.  Abdomen:, Nontender nondistended.  Bowel sounds positive.  Musculoskeletal: no clubbing / cyanosis.  Patient with tenderness throughout her anterior chest, bilateral pelvis and knees. Skin: Ecchymosis noted around right anterior patella region. Neurologic: CN 2-12 grossly intact.  Alert and oriented x 4 but had difficulty recalling recent events. Psychiatric: Normal judgment and insight. Alert and oriented x 3. Normal mood. Data Reviewed:  See HPI  Assessment and Plan: * Rhabdomyolysis CK 1586 -Secondary to fall and being found down a few days -Keep on continuous IV fluids overnight  Leukocytosis -likely reactive to rhabdomylosis  Elevated troponin 67-->80 -Likely demand ischemia from  rhabdo and dehydration  HTN (hypertension) - Stable.  Not on any antihypertensives at home.  Closed rib fracture Possible sternal manubrial fracture -She was evaluated by general surgery Dr. Ninfa Linden who did not recommend any surgical intervention.  However he recommends that she be transferred to [facility] and be followed by trauma service in consultation. -Lidocaine patch for pain -PRN Tylenol, IV opioid -PT evaluation with recurrent falls this past week.   Hypothermia - Temperature 95 F on arrival  from being found down after 2 days. -now resolved after rewarming with Bair hugger      Advance Care Planning:   Code Status: DNR -confirmed with granddaughter over the phone  Consults: General surgery  Family Communication: Discussed with granddaughter over the phone  Severity of Illness: The appropriate patient status for this patient is OBSERVATION. Observation status is judged to be reasonable and necessary in order to provide the required intensity of service to ensure the patient's safety. The patient's presenting symptoms, physical exam findings, and initial radiographic and laboratory data in the context of their medical condition is felt to place them at decreased risk for further clinical deterioration. Furthermore, it is anticipated that the patient will be medically stable for discharge from the hospital within 2 midnights of admission.   Author: Orene Desanctis, DO 07/19/2022 9:37 PM  For on call review [email or web address].

## 2022-07-19 NOTE — ED Notes (Signed)
Md pfeiffer made aware of temp. Pt cont to remain to bair hugger

## 2022-07-19 NOTE — Assessment & Plan Note (Signed)
-  likely reactive to rhabdomylosis

## 2022-07-19 NOTE — Assessment & Plan Note (Signed)
-   Temperature 95 F on arrival from being found down after 2 days. -now resolved after rewarming with Bair hugger

## 2022-07-19 NOTE — ED Notes (Signed)
Patient transported to X-ray 

## 2022-07-19 NOTE — Assessment & Plan Note (Addendum)
Possible sternal manubrial fracture -She was evaluated by general surgery Dr. Ninfa Linden who did not recommend any surgical intervention.  However he recommends that she be transferred to [facility] and be followed by trauma service in consultation. -Lidocaine patch for pain -PRN Tylenol, IV opioid -PT evaluation with recurrent falls this past week.

## 2022-07-20 ENCOUNTER — Observation Stay (HOSPITAL_COMMUNITY): Payer: Medicare Other

## 2022-07-20 DIAGNOSIS — W19XXXA Unspecified fall, initial encounter: Secondary | ICD-10-CM

## 2022-07-20 DIAGNOSIS — R54 Age-related physical debility: Secondary | ICD-10-CM

## 2022-07-20 DIAGNOSIS — S2231XA Fracture of one rib, right side, initial encounter for closed fracture: Secondary | ICD-10-CM | POA: Diagnosis not present

## 2022-07-20 LAB — CBC
HCT: 33.3 % — ABNORMAL LOW (ref 36.0–46.0)
Hemoglobin: 11.1 g/dL — ABNORMAL LOW (ref 12.0–15.0)
MCH: 31.6 pg (ref 26.0–34.0)
MCHC: 33.3 g/dL (ref 30.0–36.0)
MCV: 94.9 fL (ref 80.0–100.0)
Platelets: 205 10*3/uL (ref 150–400)
RBC: 3.51 MIL/uL — ABNORMAL LOW (ref 3.87–5.11)
RDW: 14.1 % (ref 11.5–15.5)
WBC: 11 10*3/uL — ABNORMAL HIGH (ref 4.0–10.5)
nRBC: 0 % (ref 0.0–0.2)

## 2022-07-20 LAB — CK: Total CK: 783 U/L — ABNORMAL HIGH (ref 38–234)

## 2022-07-20 MED ORDER — FENTANYL CITRATE PF 50 MCG/ML IJ SOSY
50.0000 ug | PREFILLED_SYRINGE | INTRAMUSCULAR | Status: DC | PRN
  Filled 2022-07-20: qty 1

## 2022-07-20 MED ORDER — ACETAMINOPHEN 500 MG PO TABS
500.0000 mg | ORAL_TABLET | Freq: Four times a day (QID) | ORAL | Status: DC
  Administered 2022-07-20 – 2022-07-21 (×5): 500 mg via ORAL
  Filled 2022-07-20 (×7): qty 1

## 2022-07-20 NOTE — Progress Notes (Signed)
[facility] Progress Note     Subjective: CC-  Up in chair, grand daughter at bedside. Not complaining of any chest pain or SOB. On room air. She does report some pain in her tailbone and bilateral knees.   Objective: Vital signs in last 24 hours: Temp:  [93 F (33.9 C)-97.9 F (36.6 C)] 97.9 F (36.6 C) (03/25 0756) Pulse Rate:  [75-101] 88 (03/25 0756) Resp:  [12-22] 16 (03/25 0413) BP: (112-153)/(49-74) 140/59 (03/25 0756) SpO2:  [85 %-100 %] 93 % (03/25 0756) Weight:  [60 kg] 60 kg (03/24 1345)    Intake/Output from previous day: 03/24 0701 - 03/25 0700 In: 3093.7 [I.V.:2093.7; IV Piggyback:1000] Out: -  Intake/Output this shift: No intake/output data recorded.  PE: Gen:  Alert, NAD, pleasant Card:  RRR Pulm:  CTAB, no W/R/R, rate and effort normal on room air Abd: Soft, NT/ND Ext:  bilateral knees with mild edema and ecchymosis, able to actively range the knees without pain, mild global tenderness/hypersensitivity to the knees  Lab Results:  Recent Labs    07/19/22 1359 07/20/22 0322  WBC 17.8* 11.0*  HGB 14.5 11.1*  HCT 44.3 33.3*  PLT 247 205   BMET Recent Labs    07/19/22 1359  NA 139  K 3.6  CL 106  CO2 20*  GLUCOSE 96  BUN 25*  CREATININE 0.70  CALCIUM 8.8*   PT/INR Recent Labs    07/19/22 1426  LABPROT 13.7  INR 1.1   CMP     Component Value Date/Time   NA 139 07/19/2022 1359   K 3.6 07/19/2022 1359   CL 106 07/19/2022 1359   CO2 20 (L) 07/19/2022 1359   GLUCOSE 96 07/19/2022 1359   BUN 25 (H) 07/19/2022 1359   CREATININE 0.70 07/19/2022 1359   CALCIUM 8.8 (L) 07/19/2022 1359   PROT 7.0 07/19/2022 1359   ALBUMIN 3.9 07/19/2022 1359   AST 70 (H) 07/19/2022 1359   ALT 35 07/19/2022 1359   ALKPHOS 105 07/19/2022 1359   BILITOT 1.8 (H) 07/19/2022 1359   GFRNONAA >60 07/19/2022 1359   Lipase  No results found for: "LIPASE"     Studies/Results: CT CHEST ABDOMEN PELVIS W CONTRAST  Result Date:  07/19/2022 CLINICAL DATA:  Trauma. EXAM: CT CHEST, ABDOMEN, AND PELVIS WITH CONTRAST TECHNIQUE: Multidetector CT imaging of the chest, abdomen and pelvis was performed following the standard protocol during bolus administration of intravenous contrast. RADIATION DOSE REDUCTION: This exam was performed according to the departmental dose-optimization program which includes automated exposure control, adjustment of the mA and/or kV according to patient size and/or use of iterative reconstruction technique. CONTRAST:  51mL OMNIPAQUE IOHEXOL 300 MG/ML  SOLN COMPARISON:  Chest radiograph 07/19/2022. FINDINGS: CT CHEST FINDINGS Cardiovascular: There is no cardiomegaly or pericardial effusion. Coronary vascular calcification of the LAD. Moderate atherosclerotic calcification of the thoracic aorta. No aneurysmal dilatation or dissection. The origins of the great vessels of the aortic arch and the central pulmonary arteries appear patent. Mediastinum/Nodes: No hilar or mediastinal adenopathy. Small hiatal hernia. The esophagus is grossly unremarkable. No mediastinal fluid collection. Lungs/Pleura: Trace right pleural effusion. No focal consolidation or pneumothorax. The central airways are patent. Musculoskeletal: Minimally displaced fracture of the anterior right sixth rib. Osteopenia with degenerative changes of the spine. Age indeterminate, possibly acute fracture of the sternal manubrium. Correlation with point tenderness recommended. Sclerotic lesion in the proximal right humerus without aggressive features, likely an enchondroma. CT ABDOMEN PELVIS FINDINGS No intra-abdominal free air or free  fluid. Hepatobiliary: The liver is unremarkable. No biliary dilatation. The gallbladder is unremarkable. Pancreas: Unremarkable. No pancreatic ductal dilatation or surrounding inflammatory changes. Spleen: Normal in size without focal abnormality. Adrenals/Urinary Tract: The adrenal glands are unremarkable. Bilateral renal cysts  measure up to 5 cm in the inferior pole of the right kidney. There is no hydronephrosis on either side. There is symmetric enhancement and excretion of contrast by both kidneys. The urinary bladder is grossly unremarkable. Stomach/Bowel: Moderate stool throughout the colon. There is no bowel obstruction or active inflammation. The appendix is normal. Vascular/Lymphatic: Advanced aortoiliac atherosclerotic disease. The IVC is unremarkable. No portal venous gas. There is no adenopathy. Reproductive: The uterus and ovaries are grossly unremarkable. Other: None Musculoskeletal: Osteopenia with degenerative changes of the spine. Grade 1 L5-S1 anterolisthesis. No acute osseous pathology. IMPRESSION: 1. Minimally displaced fracture of the anterior right sixth rib. 2. Age indeterminate, possibly acute fracture of the sternal manubrium. Correlation with point tenderness recommended. 3. Trace right pleural effusion. 4. No acute/traumatic intra-abdominal or pelvic pathology. 5. Moderate colonic stool burden. No bowel obstruction. Normal appendix. 6.  Aortic Atherosclerosis (ICD10-I70.0). Electronically Signed   By: Anner Crete M.D.   On: 07/19/2022 18:54   CT Head Wo Contrast  Result Date: 07/19/2022 CLINICAL DATA:  Head trauma, minor (Age >= 65y); Neck trauma (Age >= 65y) EXAM: CT HEAD WITHOUT CONTRAST CT CERVICAL SPINE WITHOUT CONTRAST TECHNIQUE: Multidetector CT imaging of the head and cervical spine was performed following the standard protocol without intravenous contrast. Multiplanar CT image reconstructions of the cervical spine were also generated. RADIATION DOSE REDUCTION: This exam was performed according to the departmental dose-optimization program which includes automated exposure control, adjustment of the mA and/or kV according to patient size and/or use of iterative reconstruction technique. COMPARISON:  04/18/2020 FINDINGS: CT HEAD FINDINGS Brain: No evidence of acute infarction, hemorrhage,  hydrocephalus, extra-axial collection or mass lesion/mass effect. Scattered low-density changes within the periventricular and subcortical white matter most compatible with chronic microvascular ischemic change. Mild diffuse cerebral volume loss. Vascular: Atherosclerotic calcifications involving the large vessels of the skull base. No unexpected hyperdense vessel. Skull: Normal. Negative for fracture or focal lesion. Sinuses/Orbits: No acute finding. Other: Negative for scalp hematoma. CT CERVICAL SPINE FINDINGS Alignment: Facet joints are aligned without dislocation or traumatic listhesis. Dens and lateral masses are aligned. Mildly exaggerated cervical lordosis. Degenerative grade 1 anterolisthesis of C4 on C5, C6 on C7, and C7 on T1. Skull base and vertebrae: No acute fracture. No primary bone lesion or focal pathologic process. Soft tissues and spinal canal: No prevertebral fluid or swelling. No visible canal hematoma. Disc levels:  Advanced multilevel cervical spondylosis. Upper chest: Negative. Other: Bilateral carotid atherosclerosis. IMPRESSION: 1. No acute intracranial abnormality. 2. No acute fracture or traumatic listhesis of the cervical spine. Electronically Signed   By: Davina Poke D.O.   On: 07/19/2022 16:12   CT Cervical Spine Wo Contrast  Result Date: 07/19/2022 CLINICAL DATA:  Head trauma, minor (Age >= 65y); Neck trauma (Age >= 65y) EXAM: CT HEAD WITHOUT CONTRAST CT CERVICAL SPINE WITHOUT CONTRAST TECHNIQUE: Multidetector CT imaging of the head and cervical spine was performed following the standard protocol without intravenous contrast. Multiplanar CT image reconstructions of the cervical spine were also generated. RADIATION DOSE REDUCTION: This exam was performed according to the departmental dose-optimization program which includes automated exposure control, adjustment of the mA and/or kV according to patient size and/or use of iterative reconstruction technique. COMPARISON:   04/18/2020 FINDINGS: CT HEAD FINDINGS  Brain: No evidence of acute infarction, hemorrhage, hydrocephalus, extra-axial collection or mass lesion/mass effect. Scattered low-density changes within the periventricular and subcortical white matter most compatible with chronic microvascular ischemic change. Mild diffuse cerebral volume loss. Vascular: Atherosclerotic calcifications involving the large vessels of the skull base. No unexpected hyperdense vessel. Skull: Normal. Negative for fracture or focal lesion. Sinuses/Orbits: No acute finding. Other: Negative for scalp hematoma. CT CERVICAL SPINE FINDINGS Alignment: Facet joints are aligned without dislocation or traumatic listhesis. Dens and lateral masses are aligned. Mildly exaggerated cervical lordosis. Degenerative grade 1 anterolisthesis of C4 on C5, C6 on C7, and C7 on T1. Skull base and vertebrae: No acute fracture. No primary bone lesion or focal pathologic process. Soft tissues and spinal canal: No prevertebral fluid or swelling. No visible canal hematoma. Disc levels:  Advanced multilevel cervical spondylosis. Upper chest: Negative. Other: Bilateral carotid atherosclerosis. IMPRESSION: 1. No acute intracranial abnormality. 2. No acute fracture or traumatic listhesis of the cervical spine. Electronically Signed   By: Davina Poke D.O.   On: 07/19/2022 16:12   DG Chest Port 1 View  Result Date: 07/19/2022 CLINICAL DATA:  fall EXAM: PORTABLE CHEST - 1 VIEW COMPARISON:  06/22/2021 FINDINGS: Lungs are clear. Heart size and mediastinal contours are within normal limits. Aortic Atherosclerosis (ICD10-170.0). No effusion. Visualized bones unremarkable. IMPRESSION: No acute cardiopulmonary disease. Electronically Signed   By: Lucrezia Europe M.D.   On: 07/19/2022 15:34   DG Knee 2 Views Left  Result Date: 07/19/2022 CLINICAL DATA:  fall EXAM: LEFT KNEE - 1-2 VIEW COMPARISON:  None Available. FINDINGS: No fracture or dislocation. There is a small effusion in the  suprapatellar bursa with scattered calcifications. Marginal spurs from the patella, medial femoral condyle and tibial plateau. Chondrocalcinosis in the lateral compartment. Mild osteopenia. Patchy femoral-popliteal arterial calcifications. IMPRESSION: 1. No fracture . 2. Knee effusion with mild degenerative changes. Electronically Signed   By: Lucrezia Europe M.D.   On: 07/19/2022 15:34   DG Knee 2 Views Right  Result Date: 07/19/2022 CLINICAL DATA:  fall EXAM: RIGHT KNEE - 1-2 VIEW COMPARISON:  07/04/2016 FINDINGS: No fracture, dislocation, or effusion. Progressive tricompartmental DJD. Patchy femoral-popliteal arterial calcifications. Regional soft tissues unremarkable. IMPRESSION: Progressive tricompartmental DJD. No acute findings. Electronically Signed   By: Lucrezia Europe M.D.   On: 07/19/2022 15:32   DG Lumbar Spine 2-3 Views  Result Date: 07/19/2022 CLINICAL DATA:  Fall, pain EXAM: LUMBAR SPINE - 3 VIEW COMPARISON:  None Available. FINDINGS: Osseous structures are osteopenic. There is loss of disc space and marginal osteophytes at each thoracic lumbar level consistent with degenerative disc disease. Dense atheromatous calcifications of the aorta. Mild compression deformity L1 should be considered age indeterminate. IMPRESSION: Osteopenia. Degenerative changes. Age-indeterminate mild compression deformity L1. Electronically Signed   By: Sammie Bench M.D.   On: 07/19/2022 15:31   DG Pelvis Portable  Result Date: 07/19/2022 CLINICAL DATA:  fall EXAM: PORTABLE PELVIS 1-2 VIEWS COMPARISON:  None Available. FINDINGS: There is no evidence of pelvic fracture or diastasis. No pelvic bone lesions are seen. Mild right hip DJD. Patchy iliofemoral arterial calcifications. Lumbar levoscoliosis with multilevel spondylitic change noted. IMPRESSION: No acute findings. Mild right hip DJD. Electronically Signed   By: Lucrezia Europe M.D.   On: 07/19/2022 15:31    Anti-infectives: Anti-infectives (From admission, onward)     None        Assessment/Plan Fall Right 6th rib fx with trace pleural effusion - repeat CXR this AM. Multimodal pain control (will  schedule tylenol) and pulm toilet/IS B/l knee pain - check xrays Pain in coccyx - CT negative for fx, could had a bone contusion. Pain control Rhabdomyolysis, elevated troponin, HTN - per primary  ID - none FEN - reg diet VTE - SCDs, lovenox Foley - none  Dispo - Therapies.  I will follow up on xrays as above. If negative we will sign off, please call with questions or concerns.   I reviewed hospitalist notes, last 24 h vitals and pain scores, last 48 h intake and output, last 24 h labs and trends, and last 24 h imaging results.    LOS: 0 days    Wellington Hampshire, [facility] Surgery 07/20/2022, 11:06 AM Please see Amion for pager number during day hours 7:00am-4:30pm

## 2022-07-20 NOTE — Evaluation (Signed)
Physical Therapy Evaluation Patient Details Name: Jillian Barker MRN: [id number] DOB: 1929/02/19 Today's Date: 07/20/2022  History of Present Illness  89 y.o. female admitted 3/24 following a fall at home. PMHx: HTN, osteoporosis.  Clinical Impression  Pt admitted with above diagnosis. Previously independent, living alone but has an Aide that assists with grocery shopping 2x/week, but otherwise reports she cares entirely for herself. A bit confused and somewhat amnesic to events preceding hospitalization. Pt oriented to month and year, but unable to state where she is or why she is in the hospital. Required mod assist for bed mobility and min assist for transfers, demonstrating urinary incontinence with standing. Pt currently with functional limitations due to the deficits listed below (see PT Problem List). Pt will benefit from acute skilled PT to increase their independence and safety with mobility to allow discharge.  Patient will benefit from continued inpatient follow up therapy, <3 hours/day.         Recommendations for follow up therapy are one component of a multi-disciplinary discharge planning process, led by the attending physician.  Recommendations may be updated based on patient status, additional functional criteria and insurance authorization.  Follow Up Recommendations Can patient physically be transported by private vehicle: Yes     Assistance Recommended at Discharge Frequent or constant Supervision/Assistance  Patient can return home with the following  A little help with walking and/or transfers;A little help with bathing/dressing/bathroom;Assistance with cooking/housework;Direct supervision/assist for medications management;Direct supervision/assist for financial management;Assist for transportation;Help with stairs or ramp for entrance    Equipment Recommendations Rolling walker (2 wheels)  Recommendations for Other Services       Functional Status Assessment  Patient has had a recent decline in their functional status and demonstrates the ability to make significant improvements in function in a reasonable and predictable amount of time.     Precautions / Restrictions Precautions Precautions: Fall Restrictions Weight Bearing Restrictions: No Other Position/Activity Restrictions: Would follow sternal precautions until cleared fully by ortho.      Mobility  Bed Mobility Overal bed mobility: Needs Assistance Bed Mobility: Supine to Sit     Supine to sit: Mod assist, HOB elevated     General bed mobility comments: Mod assist to facilitate LEs out of bed and for trunk support. Cues throughout.    Transfers Overall transfer level: Needs assistance Equipment used: Rolling walker (2 wheels) Transfers: Sit to/from Stand, Bed to chair/wheelchair/BSC Sit to Stand: Min assist   Step pivot transfers: Min assist       General transfer comment: Min assist for boost to stand x 2 from bed, x1 from recliner, and x2 from [facility]. Practiced with cues for hand placement and technique.Pt with urinary incontinence ea time.    Ambulation/Gait               General Gait Details: Deferred due to frequent incontinence.  Stairs            Wheelchair Mobility    Modified Rankin (Stroke Patients Only)       Balance Overall balance assessment: Needs assistance Sitting-balance support: No upper extremity supported, Feet supported Sitting balance-Leahy Scale: Fair     Standing balance support: Bilateral upper extremity supported, Reliant on assistive device for balance Standing balance-Leahy Scale: Poor Standing balance comment: posterior lean, needs min assist                             Pertinent Vitals/Pain Pain Assessment  Pain Assessment: No/denies pain    Home Living Family/patient expects to be discharged to:: Private residence Living Arrangements: Alone;Other (Comment) (aide comes by 2x/week, grocery  shops) Available Help at Discharge: Family (Reports limited) Type of Home: House Home Access: Stairs to enter Entrance Stairs-Rails: Right Entrance Stairs-Number of Steps: 4   Home Layout: One level Home Equipment: [place] - single point      Prior Function Prior Level of Function : History of Falls (last six months);Needs assist             Mobility Comments: Takes care of her dog. Has an aide that grocery shops for her ADLs Comments: Reports ind, but bird baths, and has an aide 2x/week but aide only does grocery shopping for her.     Hand Dominance   Dominant Hand: Right    Extremity/Trunk Assessment   Upper Extremity Assessment Upper Extremity Assessment: Defer to OT evaluation    Lower Extremity Assessment Lower Extremity Assessment: Generalized weakness;Difficult to assess due to impaired cognition       Communication   Communication: No difficulties  Cognition Arousal/Alertness: Awake/alert Behavior During Therapy: WFL for tasks assessed/performed Overall Cognitive Status: Impaired/Different from baseline Area of Impairment: Orientation, Following commands, Safety/judgement, Problem solving                 Orientation Level: Disoriented to, Place, Situation (aware of month and year)     Following Commands: Follows one step commands inconsistently Safety/Judgement: Decreased awareness of safety, Decreased awareness of deficits   Problem Solving: Slow processing, Decreased initiation, Difficulty sequencing, Requires verbal cues, Requires tactile cues General Comments: Difficulty following simple commands consistently, easily confused.        General Comments General comments (skin integrity, edema, etc.): Urinary incontinence. Assisted to [facility] x2 and able to void further. Assisted with hygiene.    Exercises     Assessment/Plan    PT Assessment Patient needs continued PT services  PT Problem List Decreased strength;Decreased range of  motion;Decreased activity tolerance;Decreased balance;Decreased mobility;Decreased cognition;Decreased knowledge of use of DME;Decreased safety awareness;Decreased knowledge of precautions       PT Treatment Interventions DME instruction;Functional mobility training;Therapeutic activities;Therapeutic exercise;Balance training;Neuromuscular re-education;Patient/family education;Stair training;Gait training;Modalities    PT Goals (Current goals can be found in the Care Plan section)  Acute Rehab PT Goals Patient Stated Goal: none stated PT Goal Formulation: All assessment and education complete, DC therapy Time For Goal Achievement: 08/03/22 Potential to Achieve Goals: Good    Frequency Min 3X/week     Co-evaluation               AM-PAC PT "6 Clicks" Mobility  Outcome Measure Help needed turning from your back to your side while in a flat bed without using bedrails?: A Little Help needed moving from lying on your back to sitting on the side of a flat bed without using bedrails?: A Lot Help needed moving to and from a bed to a chair (including a wheelchair)?: A Little Help needed standing up from a chair using your arms (e.g., wheelchair or bedside chair)?: A Little Help needed to walk in hospital room?: A Little Help needed climbing 3-5 steps with a railing? : A Lot 6 Click Score: 16    End of Session Equipment Utilized During Treatment: Gait belt Activity Tolerance: Patient tolerated treatment well Patient left: in chair;with call bell/phone within reach;with chair alarm set;with SCD's reapplied Nurse Communication: Mobility status (Incontinence, one SCD not inflating.) PT Visit Diagnosis: Unsteadiness on feet (  R26.81);Other abnormalities of gait and mobility (R26.89);Muscle weakness (generalized) (M62.81);History of falling (Z91.81);Difficulty in walking, not elsewhere classified (R26.2)    Time: [id number] PT Time Calculation (min) (ACUTE ONLY): 45 min   Charges:   PT  Evaluation $PT Eval Low Complexity: 1 Low PT Treatments $Therapeutic Activity: 23-37 mins        Candie Mile, PT, DPT Physical Therapist Acute Rehabilitation Services [place]   Ellouise Newer 07/20/2022, 9:36 AM

## 2022-07-20 NOTE — Progress Notes (Signed)
  Progress Note   Patient: Jillian Barker [id number] DOB: December 03, 1928 DOA: 07/19/2022     0 DOS: the patient was seen and examined on 07/20/2022   Brief hospital course: 89 y.o. female with medical history significant of HTN, osteoporosis who presents after a fall and found down since Friday.  Patient was found to have nondisplaced anterior right sixth rib fracture with possible sternal manubrial fracture.  Patient was also found to have an elevated CK and admitted for further workup.  Assessment and Plan: * Rhabdomyolysis CK 1586 -Secondary to fall and being found down a few days -CK improved to around 700 with IVF -Pt eating and drinking well today, hold further IV fluids  Leukocytosis -likely reactive to rhabdomylosis -To follow for now, afebrile   Elevated troponin 67-->80 -Likely demand ischemia from  rhabdo and dehydration -Seems stable at this time   HTN (hypertension) - Blood pressure stable, will monitor   Closed rib fracture Possible sternal manubrial fracture -She was evaluated by general surgery Dr. Ninfa Linden who did not recommend any surgical intervention.  However he recommends that she be transferred to [facility] and be followed by trauma service in consultation. -Continue analgesia as required -Recommendations for SNF per therapy recommendations -Patient reportedly lives alone.  Daughter has concerns that patient does not have access to 24/7 caregivers and she herself is unable to provide 24/7 care.  Thus, patient's family had already considered SNF placement   Hypothermia - Temperature 95 F on arrival from being found down after 2 days. -now resolved after rewarming with Bair hugger -Stable at this time     Subjective: Reports feeling better, patient seen during breakfast and patient eating.  Physical Exam: Vitals:   07/20/22 0002 07/20/22 0413 07/20/22 0756 07/20/22 1446  BP: (!) 128/57 (!) 144/57 (!) 140/59 (!) 130/50  Pulse: 90 92 88 84  Resp: 17  16    Temp: 97.6 F (36.4 C) 97.7 F (36.5 C) 97.9 F (36.6 C) (!) 97.5 F (36.4 C)  TempSrc:  Oral Oral Oral  SpO2: 97% 98% 93% 98%  Weight:      Height:       General exam: Awake, laying in bed, in nad Respiratory system: Normal respiratory effort, no wheezing Cardiovascular system: regular rate, s1, s2 Gastrointestinal system: Soft, nondistended, positive BS Central nervous system: CN2-12 grossly intact, strength intact Extremities: Perfused, no clubbing Skin: Normal skin turgor, no notable skin lesions seen Psychiatry: Mood normal // no visual hallucinations   Data Reviewed:  Labs reviewed: CK 783, WBC 11.0, Hgb 11.1  Family Communication: Patient in room, patient's daughter at bedside  Disposition: Status is: Observation The patient remains OBS appropriate and will d/c before 2 midnights.  Planned Discharge Destination: Skilled nursing facility    Author: Marylu Lund, MD 07/20/2022 3:38 PM  For on call review [email or web address].

## 2022-07-20 NOTE — TOC Initial Note (Addendum)
Transition of Care [facility]) - Initial/Assessment Note    Patient Details  Name: Jillian Barker MRN: [id number] Date of Birth: 1928-11-12  [facility] CM/SW Contact:    Joanne Chars, LCSW Phone Number: 07/20/2022, 11:18 AM  Clinical Narrative:     CSW met with pt and granddaughter Janett Billow regarding DC recommendation for SNF.  Pt oriented, able to participate in conversation.  Permission given to speak with granddaughter and with nellie, listed as legal guardian.  Discussed legal guardianship, per granddaughter, there is no guardian.  Granddaughter states Nell has been POA but is asking to be removed.  Pt and Janett Billow are agreeable to SNF placement, medicare choice document given.  Pt currently lives home alone, does have [place] aide 2 days per week. Janett Billow is wanting to start looking for possible ALF placement and CSW provided list of local ALF facilities as well.    CSW spoke with Renaldo Fiddler by phone.  She confirmed that she was POA but was not ever legal guardian.  She has asked that she be removed as POA and Janett Billow is taking over as POA currently.  Referral sent out in hub for SNF.           1500: Bed offers provided to pt and granddaughter (by phone)  They will review.    Expected Discharge Plan: Skilled Nursing Facility Barriers to Discharge: Continued Medical Work up, SNF Pending bed offer   Patient Goals and CMS Choice Patient states their goals for this hospitalization and ongoing recovery are:: be home CMS Medicare.gov Compare Post Acute Care list provided to:: Patient Represenative (must comment) (granddaughter Janett Billow)        Expected Discharge Plan and Services In-house Referral: Clinical Social Work   Post Acute Care Choice: [place] Living arrangements for the past 2 months: [place]                                      Prior Living Arrangements/Services Living arrangements for the past 2 months: Single  Family Home Lives with:: Self Patient language and need for interpreter reviewed:: Yes Do you feel safe going back to the place where you live?: Yes      Need for Family Participation in Patient Care: Yes (Comment) Care giver support system in place?: Yes (comment) Current home services: Homehealth aide (2 days per week) Criminal Activity/Legal Involvement Pertinent to Current Situation/Hospitalization: No - Comment as needed  Activities of Daily Living Home Assistive Devices/Equipment: Walker (specify type) ADL Screening (condition at time of admission) Patient's cognitive ability adequate to safely complete daily activities?: Yes Is the patient deaf or have difficulty hearing?: No Does the patient have difficulty seeing, even when wearing glasses/contacts?: No Does the patient have difficulty concentrating, remembering, or making decisions?: No Patient able to express need for assistance with ADLs?: Yes Does the patient have difficulty dressing or bathing?: No Independently performs ADLs?: Yes (appropriate for developmental age) Does the patient have difficulty walking or climbing stairs?: No Weakness of Legs: None Weakness of Arms/Hands: None  Permission Sought/Granted Permission sought to share information with : Family Supports Permission granted to share information with : Yes, Verbal Permission Granted  Share Information with NAME: granddaughter Janett Billow  Permission granted to share info w AGENCY: SNF        Emotional Assessment Appearance:: Appears stated age Attitude/Demeanor/Rapport: Engaged Affect (typically observed): Appropriate, Pleasant Orientation: :  Oriented to Self, Oriented to Place, Oriented to  Time, Oriented to Situation      Admission diagnosis:  Rhabdomyolysis [M62.82] Dehydration [E86.0] Advanced age [R54] Hypothermia, initial encounter [T68.XXXA] Fall, initial encounter [id number]] Closed fracture of one rib of right side, initial encounter  [S22.31XA] Patient Active Problem List   Diagnosis Date Noted   Rhabdomyolysis 07/19/2022   Hypothermia 07/19/2022   Closed rib fracture 07/19/2022   Sternal manubrial dissociation, closed fracture 07/19/2022   HTN (hypertension) 07/19/2022   Elevated troponin 07/19/2022   Leukocytosis 07/19/2022   PCP:  Pa, [place] Pharmacy:   [facility] [address] [place] [address] Phone: [phone] Fax: [phone]  [id number] [place], [place] [address] Phone: [phone] Fax: [phone]     Social Determinants of Health (SDOH) Social History: SDOH Screenings   Food Insecurity: No Food Insecurity (07/20/2022)  Housing: Low Risk  (07/20/2022)  Transportation Needs: No Transportation Needs (07/20/2022)  Utilities: Not At Risk (07/20/2022)  Tobacco Use: Low Risk  (06/24/2021)   SDOH Interventions:     Readmission Risk Interventions     No data to display

## 2022-07-20 NOTE — NC FL2 (Signed)
  [place] LEVEL OF CARE FORM     IDENTIFICATION  Patient Name: Jillian Barker Birthdate: [birth year] Sex: female Admission Date (Current Location): 07/19/2022  [facility] and [place] Number:  Herbalist and Address:  [facility], [place] [address] [place], [place] [address]      Provider Number: [id number]  Attending Physician Name and Address:  Donne Hazel, MD  Relative Name and Phone Number:  Norva Riffle   [phone]    Current Level of Care: Hospital Recommended Level of Care: [place] Prior Approval Number:    Date Approved/Denied:   PASRR Number: [id number]  Discharge Plan: SNF    Current Diagnoses: Patient Active Problem List   Diagnosis Date Noted   Rhabdomyolysis 07/19/2022   Hypothermia 07/19/2022   Closed rib fracture 07/19/2022   Sternal manubrial dissociation, closed fracture 07/19/2022   HTN (hypertension) 07/19/2022   Elevated troponin 07/19/2022   Leukocytosis 07/19/2022    Orientation RESPIRATION BLADDER Height & Weight     Self, Time, Situation, Place  Normal Incontinent, External catheter Weight: 132 lb 4.4 oz (60 kg) Height:  4\' 11"  (149.9 cm)  BEHAVIORAL SYMPTOMS/MOOD NEUROLOGICAL BOWEL NUTRITION STATUS      Continent Diet (see discharge summary)  AMBULATORY STATUS COMMUNICATION OF NEEDS Skin   Extensive Assist Verbally Other (Comment) (redness)                       Personal Care Assistance Level of Assistance  Bathing, Feeding, Dressing Bathing Assistance: Limited assistance Feeding assistance: Independent Dressing Assistance: Limited assistance     Functional Limitations Info  Sight, Hearing, Speech Sight Info: Adequate Hearing Info: Impaired Speech Info: Adequate    SPECIAL CARE FACTORS FREQUENCY  PT (By licensed PT), OT (By licensed OT)     PT Frequency: 5x a week OT Frequency: 5x a week            Contractures Contractures  Info: Not present    Additional Factors Info  Code Status, Allergies Code Status Info: DNR Allergies Info: Penicillins           Current Medications (07/20/2022):  This is the current hospital active medication list Current Facility-Administered Medications  Medication Dose Route Frequency Provider Last Rate Last Admin   acetaminophen (TYLENOL) tablet 500 mg  500 mg Oral Q6H Meuth, Brooke A, PA-C       enoxaparin (LOVENOX) injection 40 mg  40 mg Subcutaneous Q24H Tu, Ching T, DO       fentaNYL (SUBLIMAZE) injection 50 mcg  50 mcg Intravenous Q2H PRN Meuth, Brooke A, PA-C       lidocaine (LIDODERM) 5 % 1 patch  1 patch Transdermal Q24H Tu, Ching T, DO         Discharge Medications: Please see discharge summary for a list of discharge medications.  Relevant Imaging Results:  Relevant Lab Results:   Additional Information SSN: [id number]  Joanne Chars, LCSW

## 2022-07-20 NOTE — Hospital Course (Signed)
Jillian Barker is a 89 y.o. female with a history of hypertension and osteoporosis. Patient presented secondary to fall and being found down for several days. She was found to have a nondisplaced anterior right sixth rib fracture in addition to sternal manubrial fracture. Trauma surgery consulted and recommended conservative management. Patient also found to have traumatic rhabdomyolysis from being down, which was treated with IV fluids.

## 2022-07-20 NOTE — Evaluation (Signed)
Occupational Therapy Evaluation Patient Details Name: Jillian Barker MRN: [id number] DOB: June 07, 1928 Today's Date: 07/20/2022   History of Present Illness 89 y.o. female admitted 3/24 following a fall at home. PMHx: HTN, osteoporosis.   Clinical Impression   Patient admitted for the diagnosis above.  PTA she lives alone with occasional check-ins from family, and a PCA that visits 2x/wk for community mobility and groceries.  Patient walks with a cane, completes her own sponge baths, and completes light meal prep.  Currently she is needing up to Mod A for lower body ADL and basic transfers.  OT is indicated in the acute setting to address deficits, and assist with transition to the next level of care.  Patient would benefit from a more aggressive post acute rehab than home based therapies, <3 hours/day to maximize her functional status.        Recommendations for follow up therapy are one component of a multi-disciplinary discharge planning process, led by the attending physician.  Recommendations may be updated based on patient status, additional functional criteria and insurance authorization.   Assistance Recommended at Discharge Frequent or constant Supervision/Assistance  Patient can return home with the following A lot of help with walking and/or transfers;A lot of help with bathing/dressing/bathroom;Assist for transportation;Assistance with cooking/housework;Help with stairs or ramp for entrance;Direct supervision/assist for medications management    Functional Status Assessment  Patient has had a recent decline in their functional status and demonstrates the ability to make significant improvements in function in a reasonable and predictable amount of time.  Equipment Recommendations  None recommended by OT    Recommendations for Other Services       Precautions / Restrictions Precautions Precautions: Fall Precaution Comments: nondisplaced anterior right sixth rib fracture with  possible sternal manubrial fracture Restrictions Weight Bearing Restrictions: No      Mobility Bed Mobility               General bed mobility comments: up in the recliner    Transfers Overall transfer level: Needs assistance Equipment used: Rolling walker (2 wheels) Transfers: Sit to/from Stand, Bed to chair/wheelchair/BSC Sit to Stand: Min assist, Mod assist     Step pivot transfers: Min assist, Mod assist            Balance Overall balance assessment: Needs assistance Sitting-balance support: No upper extremity supported, Feet supported Sitting balance-Leahy Scale: Fair     Standing balance support: Reliant on assistive device for balance Standing balance-Leahy Scale: Poor                             ADL either performed or assessed with clinical judgement   ADL Overall ADL's : Needs assistance/impaired Eating/Feeding: Set up;Sitting   Grooming: Wash/dry hands;Wash/dry face;Set up;Sitting   Upper Body Bathing: Set up;Sitting   Lower Body Bathing: Maximal assistance;Sit to/from stand   Upper Body Dressing : Minimal assistance;Sitting   Lower Body Dressing: Sit to/from stand;Moderate assistance   Toilet Transfer: Moderate assistance;Rolling walker (2 wheels);BSC/3in1;Stand-pivot;Maximal assistance                   Vision Patient Visual Report: No change from baseline                  Pertinent Vitals/Pain Pain Assessment Pain Assessment: No/denies pain     Hand Dominance Right   Extremity/Trunk Assessment Upper Extremity Assessment Upper Extremity Assessment: Generalized weakness   Lower Extremity Assessment Lower Extremity  Assessment: Defer to PT evaluation   Cervical / Trunk Assessment Cervical / Trunk Assessment: Kyphotic   Communication Communication Communication: No difficulties   Cognition Arousal/Alertness: Awake/alert Behavior During Therapy: WFL for tasks assessed/performed Overall Cognitive Status:  Impaired/Different from baseline Area of Impairment: Orientation, Following commands, Safety/judgement, Problem solving                 Orientation Level: Place, Time, Situation     Following Commands: Follows one step commands inconsistently     Problem Solving: Requires verbal cues, Requires tactile cues, Slow processing General Comments: asking same questions multiple times     General Comments   VSS on RA    Exercises     Shoulder Instructions      Home Living Family/patient expects to be discharged to:: Private residence Living Arrangements: Alone;Other (Comment) Available Help at Discharge: Family;Personal care attendant;Available PRN/intermittently Type of Home: House Home Access: Stairs to enter [employer] of Steps: 4 Entrance Stairs-Rails: Right Home Layout: One level     Bathroom Shower/Tub: Teacher, early years/pre: Standard Bathroom Accessibility: Yes How Accessible: Accessible via walker Home Equipment: Madelia - single point          Prior Functioning/Environment Prior Level of Function : History of Falls (last six months);Needs assist             Mobility Comments: Takes care of her dog. Has an aide that grocery shops for her ADLs Comments: Reports ind with sponge baths.  has an aide 2x/week but aide only does grocery shopping for her.  cares for her own medications and uses microwavable meals.        OT Problem List: Decreased strength;Impaired balance (sitting and/or standing);Decreased knowledge of use of DME or AE;Decreased safety awareness;Decreased cognition;Pain      OT Treatment/Interventions: Self-care/ADL training;Therapeutic activities;Cognitive remediation/compensation;Patient/family education;DME and/or AE instruction;Balance training    OT Goals(Current goals can be found in the care plan section) Acute Rehab OT Goals Patient Stated Goal: Return home OT Goal Formulation: With patient Time For Goal  Achievement: 08/03/22 Potential to Achieve Goals: Fair ADL Goals Pt Will Perform Grooming: with min guard assist;standing Pt Will Perform Lower Body Dressing: with min assist;sit to/from stand Pt Will Transfer to Toilet: ambulating;regular height toilet;with min assist  OT Frequency: Min 2X/week    Co-evaluation              AM-PAC OT "6 Clicks" Daily Activity     Outcome Measure Help from another person eating meals?: None Help from another person taking care of personal grooming?: None Help from another person toileting, which includes using toliet, bedpan, or urinal?: A Lot Help from another person bathing (including washing, rinsing, drying)?: A Lot Help from another person to put on and taking off regular upper body clothing?: A Little Help from another person to put on and taking off regular lower body clothing?: A Lot 6 Click Score: 17   End of Session Equipment Utilized During Treatment: Gait belt;Rolling walker (2 wheels) Nurse Communication: Mobility status  Activity Tolerance: Patient tolerated treatment well Patient left: in chair;with call bell/phone within reach;with chair alarm set  OT Visit Diagnosis: Unsteadiness on feet (R26.81);Muscle weakness (generalized) (M62.81);History of falling (Z91.81);Other symptoms and signs involving cognitive function                Time: [id number] OT Time Calculation (min): 18 min Charges:  OT General Charges $OT Visit: 1 Visit OT Evaluation $OT Eval Moderate Complexity: 1 Mod  07/20/2022  RP, OTR/L  Acute Rehabilitation Services  Office:  [phone]   Metta Clines 07/20/2022, 4:26 PM

## 2022-07-21 DIAGNOSIS — R54 Age-related physical debility: Secondary | ICD-10-CM | POA: Diagnosis not present

## 2022-07-21 DIAGNOSIS — W19XXXA Unspecified fall, initial encounter: Secondary | ICD-10-CM | POA: Diagnosis not present

## 2022-07-21 DIAGNOSIS — S2231XA Fracture of one rib, right side, initial encounter for closed fracture: Secondary | ICD-10-CM | POA: Diagnosis not present

## 2022-07-21 LAB — COMPREHENSIVE METABOLIC PANEL
ALT: 37 U/L (ref 0–44)
AST: 48 U/L — ABNORMAL HIGH (ref 15–41)
Albumin: 2.8 g/dL — ABNORMAL LOW (ref 3.5–5.0)
Alkaline Phosphatase: 82 U/L (ref 38–126)
Anion gap: 11 (ref 5–15)
BUN: 20 mg/dL (ref 8–23)
CO2: 21 mmol/L — ABNORMAL LOW (ref 22–32)
Calcium: 8.1 mg/dL — ABNORMAL LOW (ref 8.9–10.3)
Chloride: 104 mmol/L (ref 98–111)
Creatinine, Ser: 0.92 mg/dL (ref 0.44–1.00)
GFR, Estimated: 59 mL/min — ABNORMAL LOW (ref >60)
Glucose, Bld: 117 mg/dL — ABNORMAL HIGH (ref 70–99)
Potassium: 3.2 mmol/L — ABNORMAL LOW (ref 3.5–5.1)
Sodium: 136 mmol/L (ref 135–145)
Total Bilirubin: 1.2 mg/dL (ref 0.3–1.2)
Total Protein: 5.6 g/dL — ABNORMAL LOW (ref 6.5–8.1)

## 2022-07-21 MED ORDER — POTASSIUM CHLORIDE CRYS ER 20 MEQ PO TBCR
40.0000 meq | EXTENDED_RELEASE_TABLET | ORAL | Status: AC
  Administered 2022-07-21 (×2): 40 meq via ORAL
  Filled 2022-07-21 (×2): qty 2

## 2022-07-21 MED ORDER — TRAZODONE HCL 50 MG PO TABS
100.0000 mg | ORAL_TABLET | Freq: Every evening | ORAL | Status: DC | PRN
  Administered 2022-07-22: 100 mg via ORAL
  Filled 2022-07-21: qty 2

## 2022-07-21 MED ORDER — FENTANYL CITRATE PF 50 MCG/ML IJ SOSY
12.5000 ug | PREFILLED_SYRINGE | Freq: Four times a day (QID) | INTRAMUSCULAR | Status: DC | PRN
  Administered 2022-07-22: 12.5 ug via INTRAVENOUS
  Filled 2022-07-21: qty 1

## 2022-07-21 MED ORDER — AMLODIPINE BESYLATE 5 MG PO TABS
5.0000 mg | ORAL_TABLET | Freq: Every day | ORAL | Status: DC
  Administered 2022-07-21 – 2022-07-23 (×3): 5 mg via ORAL
  Filled 2022-07-21 (×3): qty 1

## 2022-07-21 MED ORDER — HYDRALAZINE HCL 20 MG/ML IJ SOLN: 10.0000 mg | INTRAMUSCULAR | Status: DC | PRN

## 2022-07-21 NOTE — Progress Notes (Signed)
Mobility Specialist Progress Note   07/21/22 1000  Mobility  Activity Transferred to/from [facility]  Level of Assistance Moderate assist, patient does 50-74%  Assistive Device Front wheel walker  Range of Motion/Exercises Active;All extremities  Activity Response Tolerated well   Patient received in recliner chair, reluctant to participate stating that she overall did not feel well. Eventually agreeable with encouragement. Grimaced in pain throughout session and while palpating BLE's, arms, etc. Required mod A + verbal cues for hand placement to stand/pivot to recliner chair. Upon standing, patient very anxious in fear of falling, removing her hands from RW to get to [facility] needing cues to redirect. Also had an episode of urine incontinence. Completed pericare with max A while seated on BSC. Did better returning to recliner chair, less anxious and able to take minimal steps. Was left in recliner with all needs met, call bell in reach and family present.   [place] Lei Dower, BS EXP Mobility Specialist Please contact via SecureChat or Rehab office at [phone]

## 2022-07-21 NOTE — TOC Progression Note (Addendum)
Transition of Care [facility]) - Progression Note    Patient Details  Name: Jillian Barker MRN: [id number] Date of Birth: Nov 17, 1928  [facility] of [facility]) CM/SW Contact  Joanne Chars, LCSW Phone Number: 07/21/2022, 10:46 AM  Clinical Narrative:   CSW spoke with granddaughter Janett Billow.  She has not made final decision on SNF yet, will let CSW know shortly.    1410: TC Jessica.  She would like to accept offer at [facility].  Waiting on confirmation from [facility].  Auth request submitted in navi and approved: [id number], 3 days: 3/27-3/29.   Expected Discharge Plan: [place] Barriers to Discharge: Continued Medical Work up, SNF Pending bed offer  Expected Discharge Plan and Services In-house Referral: Clinical Social Work   Post Acute Care Choice: [place] Living arrangements for the past 2 months: Single Family Home                                       Social Determinants of Health (SDOH) Interventions SDOH Screenings   Food Insecurity: No Food Insecurity (07/20/2022)  Housing: Low Risk  (07/20/2022)  Transportation Needs: No Transportation Needs (07/20/2022)  Utilities: Not At Risk (07/20/2022)  Tobacco Use: Low Risk  (06/24/2021)    Readmission Risk Interventions     No data to display

## 2022-07-21 NOTE — Progress Notes (Signed)
  Progress Note   Patient: Jillian Barker [id number] DOB: [birth year] DOA: 07/19/2022     0 DOS: the patient was seen and examined on 07/21/2022   Brief hospital course: 89 y.o. female with medical history significant of HTN, osteoporosis who presents after a fall and found down since Friday.  Patient was found to have nondisplaced anterior right sixth rib fracture with possible sternal manubrial fracture.  Patient was also found to have an elevated CK and admitted for further workup.  Assessment and Plan: * Rhabdomyolysis CK 1586, trended down to 783 with aggressive IVF -Pt tolerating PO, fluids now off -cont to follow bmet trends  Leukocytosis -likely reactive to rhabdomylosis -To follow for now, afebrile   Elevated troponin 67-->80 -Likely demand ischemia from  rhabdo and dehydration -Seems stable at this time   HTN (hypertension) - Blood pressure stable, will monitor   Closed rib fracture Possible sternal manubrial fracture -She was evaluated by general surgery Dr. Ninfa Linden who did not recommend any surgical intervention.  However he recommends that she be transferred to [facility] and be followed by trauma service in consultation. -Continue analgesia as required -Recommendations for SNF per therapy recommendations -Patient reportedly lives alone.  Daughter has concerns that patient does not have access to 24/7 caregivers and she herself is unable to provide 24/7 care for pt -TOC following for SNF placement   Hypothermia - Temperature 95 F on arrival from being found down after 2 days. -now resolved after rewarming with Bair hugger -Remains stable at this time  Insomnia -Family reports pt being awake most of the night -Currently drowsy and not eating much as a result -Will give trial of trazadone at night. EKG reviewed, unremarkable QTc  Hypokalemia -will replace -recheck bmet in AM     Subjective: States not feeling hungry this AM  Physical Exam: Vitals:    07/20/22 2129 07/21/22 0514 07/21/22 0735 07/21/22 1424  BP: (!) 178/59 (!) 180/81 (!) 176/86 130/73  Pulse: 89 99 99 89  Resp: 20 20    Temp: 98.1 F (36.7 C) 98.1 F (36.7 C) 98 F (36.7 C) 98 F (36.7 C)  TempSrc: Oral Oral Oral Oral  SpO2: 94% 95% 93% 97%  Weight:      Height:       General exam: Conversant, in no acute distress Respiratory system: normal chest rise, clear, no audible wheezing Cardiovascular system: regular rhythm, s1-s2 Gastrointestinal system: Nondistended, nontender, pos BS Central nervous system: No seizures, no tremors Extremities: No cyanosis, no joint deformities Skin: No rashes, no pallor Psychiatry: Affect normal // no auditory hallucinations   Data Reviewed:  Labs reviewed: Na 136, K 3.2, Cr 0.92  Family Communication: Patient in room, patient's daughter at bedside  Disposition: Status is: Observation The patient remains OBS appropriate and will d/c before 2 midnights.  Planned Discharge Destination: Skilled nursing facility    Author: Marylu Lund, MD 07/21/2022 3:52 PM  For on call review [email or web address].

## 2022-07-21 NOTE — Plan of Care (Signed)

## 2022-07-22 ENCOUNTER — Observation Stay (HOSPITAL_COMMUNITY): Payer: Medicare Other

## 2022-07-22 DIAGNOSIS — S2231XA Fracture of one rib, right side, initial encounter for closed fracture: Secondary | ICD-10-CM | POA: Diagnosis present

## 2022-07-22 DIAGNOSIS — Z79899 Other long term (current) drug therapy: Secondary | ICD-10-CM | POA: Diagnosis not present

## 2022-07-22 DIAGNOSIS — G4700 Insomnia, unspecified: Secondary | ICD-10-CM | POA: Diagnosis present

## 2022-07-22 DIAGNOSIS — K5900 Constipation, unspecified: Secondary | ICD-10-CM | POA: Diagnosis present

## 2022-07-22 DIAGNOSIS — Z8672 Personal history of thrombophlebitis: Secondary | ICD-10-CM | POA: Diagnosis not present

## 2022-07-22 DIAGNOSIS — F03A18 Unspecified dementia, mild, with other behavioral disturbance: Secondary | ICD-10-CM | POA: Diagnosis present

## 2022-07-22 DIAGNOSIS — I10 Essential (primary) hypertension: Secondary | ICD-10-CM | POA: Diagnosis present

## 2022-07-22 DIAGNOSIS — E876 Hypokalemia: Secondary | ICD-10-CM | POA: Diagnosis present

## 2022-07-22 DIAGNOSIS — W19XXXA Unspecified fall, initial encounter: Secondary | ICD-10-CM | POA: Diagnosis present

## 2022-07-22 DIAGNOSIS — Z880 Allergy status to penicillin: Secondary | ICD-10-CM | POA: Diagnosis not present

## 2022-07-22 DIAGNOSIS — T796XXA Traumatic ischemia of muscle, initial encounter: Secondary | ICD-10-CM | POA: Diagnosis present

## 2022-07-22 DIAGNOSIS — Z66 Do not resuscitate: Secondary | ICD-10-CM | POA: Diagnosis present

## 2022-07-22 DIAGNOSIS — M6282 Rhabdomyolysis: Secondary | ICD-10-CM | POA: Diagnosis present

## 2022-07-22 DIAGNOSIS — R7989 Other specified abnormal findings of blood chemistry: Secondary | ICD-10-CM | POA: Diagnosis present

## 2022-07-22 DIAGNOSIS — M25561 Pain in right knee: Secondary | ICD-10-CM | POA: Diagnosis present

## 2022-07-22 DIAGNOSIS — Z7983 Long term (current) use of bisphosphonates: Secondary | ICD-10-CM | POA: Diagnosis not present

## 2022-07-22 DIAGNOSIS — T68XXXA Hypothermia, initial encounter: Secondary | ICD-10-CM | POA: Diagnosis not present

## 2022-07-22 DIAGNOSIS — R680 Hypothermia, not associated with low environmental temperature: Secondary | ICD-10-CM | POA: Diagnosis present

## 2022-07-22 DIAGNOSIS — R296 Repeated falls: Secondary | ICD-10-CM | POA: Diagnosis present

## 2022-07-22 DIAGNOSIS — S2221XA Fracture of manubrium, initial encounter for closed fracture: Secondary | ICD-10-CM | POA: Diagnosis present

## 2022-07-22 DIAGNOSIS — M25562 Pain in left knee: Secondary | ICD-10-CM | POA: Diagnosis present

## 2022-07-22 DIAGNOSIS — D72829 Elevated white blood cell count, unspecified: Secondary | ICD-10-CM | POA: Diagnosis present

## 2022-07-22 DIAGNOSIS — E860 Dehydration: Secondary | ICD-10-CM | POA: Diagnosis present

## 2022-07-22 DIAGNOSIS — Z881 Allergy status to other antibiotic agents status: Secondary | ICD-10-CM | POA: Diagnosis not present

## 2022-07-22 DIAGNOSIS — M810 Age-related osteoporosis without current pathological fracture: Secondary | ICD-10-CM | POA: Diagnosis present

## 2022-07-22 LAB — CBC
HCT: 32.1 % — ABNORMAL LOW (ref 36.0–46.0)
Hemoglobin: 11 g/dL — ABNORMAL LOW (ref 12.0–15.0)
MCH: 31.9 pg (ref 26.0–34.0)
MCHC: 34.3 g/dL (ref 30.0–36.0)
MCV: 93 fL (ref 80.0–100.0)
Platelets: 201 10*3/uL (ref 150–400)
RBC: 3.45 MIL/uL — ABNORMAL LOW (ref 3.87–5.11)
RDW: 14.2 % (ref 11.5–15.5)
WBC: 13.2 10*3/uL — ABNORMAL HIGH (ref 4.0–10.5)
nRBC: 0 % (ref 0.0–0.2)

## 2022-07-22 LAB — COMPREHENSIVE METABOLIC PANEL
ALT: 33 U/L (ref 0–44)
AST: 32 U/L (ref 15–41)
Albumin: 2.6 g/dL — ABNORMAL LOW (ref 3.5–5.0)
Alkaline Phosphatase: 74 U/L (ref 38–126)
Anion gap: 7 (ref 5–15)
BUN: 17 mg/dL (ref 8–23)
CO2: 22 mmol/L (ref 22–32)
Calcium: 8 mg/dL — ABNORMAL LOW (ref 8.9–10.3)
Chloride: 108 mmol/L (ref 98–111)
Creatinine, Ser: 0.7 mg/dL (ref 0.44–1.00)
GFR, Estimated: >60 mL/min (ref >60)
Glucose, Bld: 103 mg/dL — ABNORMAL HIGH (ref 70–99)
Potassium: 3.6 mmol/L (ref 3.5–5.1)
Sodium: 137 mmol/L (ref 135–145)
Total Bilirubin: 1.2 mg/dL (ref 0.3–1.2)
Total Protein: 5.2 g/dL — ABNORMAL LOW (ref 6.5–8.1)

## 2022-07-22 MED ORDER — ACETAMINOPHEN 500 MG PO TABS
1000.0000 mg | ORAL_TABLET | Freq: Three times a day (TID) | ORAL | Status: DC
  Administered 2022-07-22 – 2022-07-23 (×2): 1000 mg via ORAL
  Filled 2022-07-22 (×3): qty 2

## 2022-07-22 MED ORDER — TRAMADOL HCL 50 MG PO TABS
50.0000 mg | ORAL_TABLET | Freq: Four times a day (QID) | ORAL | Status: DC | PRN
  Administered 2022-07-22: 50 mg via ORAL
  Filled 2022-07-22: qty 1

## 2022-07-22 MED ORDER — POLYETHYLENE GLYCOL 3350 17 G PO PACK
17.0000 g | PACK | Freq: Every day | ORAL | Status: DC
  Administered 2022-07-22 – 2022-07-23 (×2): 17 g via ORAL
  Filled 2022-07-22 (×2): qty 1

## 2022-07-22 NOTE — Progress Notes (Signed)
PROGRESS NOTE    Jillian Barker  [id number] DOB: 10/14/1928 DOA: 07/19/2022 PCP: [facility]   Brief Narrative: 89 y.o. female with medical history significant of HTN, osteoporosis who presents after a fall and found down since Friday.  Patient was found to have nondisplaced anterior right sixth rib fracture with possible sternal manubrial fracture.  Patient was also found to have an elevated CK and admitted for further workup.   Assessment and Plan:  Traumatic rhabdomyolysis Secondary to being down. CK total of 1,586 on admission. IV fluids started with improvement of CK to 783.  Closed rib fracture Sternal manubrium fracture Patient evaluated by trauma surgery with recommendations for conservative management.  Leukocytosis Mild. Stable. No evidence of infection.  Elevated troponin Mild elevation. No associated chest pain. No EKG changes concerning for ACS. Presumed demand ischemia.  Primary hypertension -Continue amlodipine  Hypothermia Present on admission. Likely secondary to being found down. Resolved.   DVT prophylaxis: Lovenox Code Status:   Code Status: DNR Family Communication: Daughter at bedside Disposition Plan: Discharge to SNF once bed is available   Consultants:  Trauma surgery  Procedures:    Antimicrobials:     Subjective: Patient reports continued leg and rib pain. No other concerns.  Objective: BP (!) 166/65 (BP Location: Left Arm)   Pulse 77   Temp 97.6 F (36.4 C) (Oral)   Resp 16   Ht 4\' 11"  (1.499 m)   Wt 60 kg   SpO2 96%   BMI 26.72 kg/m   Examination:  General exam: Appears calm and comfortable Respiratory system: Clear to auscultation. Respiratory effort normal. Cardiovascular system: S1 & S2 heard, RRR. No murmurs, rubs, gallops or clicks. Gastrointestinal system: Abdomen is nondistended, soft and generally tender. Normal bowel sounds heard. Central nervous system: Alert and  oriented. Musculoskeletal: No edema. Bilateral tenderness along tibia bones Skin: No cyanosis. No rashes Psychiatry: Judgement and insight appear normal. Mood & affect appropriate.    Data Reviewed: I have personally reviewed following labs and imaging studies  CBC Lab Results  Component Value Date   WBC 13.2 (H) 07/22/2022   RBC 3.45 (L) 07/22/2022   HGB 11.0 (L) 07/22/2022   HCT 32.1 (L) 07/22/2022   MCV 93.0 07/22/2022   MCH 31.9 07/22/2022   PLT 201 07/22/2022   MCHC 34.3 07/22/2022   RDW 14.2 07/22/2022   LYMPHSABS 1.1 07/19/2022   MONOABS 1.1 (H) 07/19/2022   EOSABS 0.0 07/19/2022   BASOSABS 0.1 99991111     Last metabolic panel Lab Results  Component Value Date   NA 137 07/22/2022   K 3.6 07/22/2022   CL 108 07/22/2022   CO2 22 07/22/2022   BUN 17 07/22/2022   CREATININE 0.70 07/22/2022   GLUCOSE 103 (H) 07/22/2022   GFRNONAA >60 07/22/2022   CALCIUM 8.0 (L) 07/22/2022   PROT 5.2 (L) 07/22/2022   ALBUMIN 2.6 (L) 07/22/2022   BILITOT 1.2 07/22/2022   ALKPHOS 74 07/22/2022   AST 32 07/22/2022   ALT 33 07/22/2022   ANIONGAP 7 07/22/2022    GFR: Estimated Creatinine Clearance: 36.1 mL/min (by C-G formula based on SCr of 0.7 mg/dL).  No results found for this or any previous visit (from the past 240 hour(s)).    Radiology Studies: DG Knee Left Port  Result Date: 07/20/2022 CLINICAL DATA:  Bilateral knee pain, fall on Friday EXAM: PORTABLE RIGHT KNEE - 1-2 VIEW; PORTABLE LEFT KNEE - 1-2 VIEW COMPARISON:  07/19/2022 FINDINGS: No fracture or  dislocation of the bilateral knees. Severe tricompartmental arthrosis, worst in the patellofemoral compartments bilaterally. Loose body in the left superior joint space with a small knee joint effusion. Additional loose bodies in the posterior joint spaces bilaterally. Vascular calcinosis. Soft tissues unremarkable. IMPRESSION: 1. No fracture or dislocation of the bilateral knees. 2. Severe tricompartmental arthrosis,  worst in the patellofemoral compartments bilaterally. 3. Loose bodies in the left superior joint space with a small knee joint effusion. Electronically Signed   By: Delanna Ahmadi M.D.   On: 07/20/2022 12:15   DG Knee Right Port  Result Date: 07/20/2022 CLINICAL DATA:  Bilateral knee pain, fall on Friday EXAM: PORTABLE RIGHT KNEE - 1-2 VIEW; PORTABLE LEFT KNEE - 1-2 VIEW COMPARISON:  07/19/2022 FINDINGS: No fracture or dislocation of the bilateral knees. Severe tricompartmental arthrosis, worst in the patellofemoral compartments bilaterally. Loose body in the left superior joint space with a small knee joint effusion. Additional loose bodies in the posterior joint spaces bilaterally. Vascular calcinosis. Soft tissues unremarkable. IMPRESSION: 1. No fracture or dislocation of the bilateral knees. 2. Severe tricompartmental arthrosis, worst in the patellofemoral compartments bilaterally. 3. Loose bodies in the left superior joint space with a small knee joint effusion. Electronically Signed   By: Delanna Ahmadi M.D.   On: 07/20/2022 12:15   DG CHEST PORT 1 VIEW  Result Date: 07/20/2022 CLINICAL DATA:  Right rib fractures. EXAM: PORTABLE CHEST 1 VIEW COMPARISON:  July 19, 2022. FINDINGS: The heart size and mediastinal contours are within normal limits. No acute pulmonary abnormality is noted. Right rib fractures noted on prior CT scan are not well visualized on this radiograph. IMPRESSION: No acute abnormality seen. Aortic Atherosclerosis (ICD10-I70.0). Electronically Signed   By: Marijo Conception M.D.   On: 07/20/2022 11:40      LOS: 0 days    Cordelia Poche, MD [facility] 07/22/2022, 9:48 AM   If 7PM-7AM, please contact night-coverage [email or web address]

## 2022-07-22 NOTE — Progress Notes (Signed)
Mobility Specialist Progress Note   07/22/22 1355  Mobility  Activity Repositioned in chair (BLE exercises)  Level of Assistance Standby assist, set-up cues, supervision of patient - no hands on  Assistive Device None  Range of Motion/Exercises Active;All extremities  Activity Response Tolerated well   Patient received in recliner, sleep and easily aroused. Reluctant to participate secondary to fear of incontinence. Eventually agreeable to participate with encouragement. Refused ambulation or getting to [facility], opted to BLE chair exercises. Completed x4 LE exercises with supervision. Grimaced in pain with light palpation to her BLE's. Was left in recliner with all needs met, call bell in reach.   Jillian Barker, BS EXP Mobility Specialist Please contact via SecureChat or Rehab office at [phone]

## 2022-07-22 NOTE — Progress Notes (Signed)
Physical Therapy Treatment Patient Details Name: Jillian Barker MRN: [id number] DOB: [birth year] Today's Date: 07/22/2022   History of Present Illness 89 y.o. female admitted 3/24 following a fall at home. PMHx: HTN, osteoporosis.    PT Comments    Pt is slowly progressing towards goals. She continues to mobilize but requires some encouragement. Pt continues to deny pain and is perseverating on incontinence at this time. Pt is Mod A for bed mobility, sit to stand and very short distance gait at this time. Due to pt current functional status, home set up and available assistance at home recommending skilled physical therapy services at a higher level of care and frequency on discharge from acute care hospital setting in order to decrease risk for falls, injury, immobility, skin break down and re-hospitalization. Pt HR was WNL throughout session.     Recommendations for follow up therapy are one component of a multi-disciplinary discharge planning process, led by the attending physician.  Recommendations may be updated based on patient status, additional functional criteria and insurance authorization.  Follow Up Recommendations  Can patient physically be transported by private vehicle: Yes    Assistance Recommended at Discharge Frequent or constant Supervision/Assistance  Patient can return home with the following A little help with walking and/or transfers;Assistance with cooking/housework;Direct supervision/assist for financial management;Assist for transportation;Help with stairs or ramp for entrance   Equipment Recommendations  Rolling walker (2 wheels)    Recommendations for Other Services       Precautions / Restrictions Precautions Precautions: Fall Precaution Comments: nondisplaced anterior right sixth rib fracture with possible sternal manubrial fracture Restrictions Weight Bearing Restrictions: No Other Position/Activity Restrictions: Would follow sternal precautions until  cleared fully by ortho.     Mobility  Bed Mobility Overal bed mobility: Needs Assistance Bed Mobility: Supine to Sit     Supine to sit: Mod assist     General bed mobility comments: up in the recliner at end of session. Pt was Mod A for the trunk to get to sitting with step by step directions for sequencing. Patient Response: Cooperative, Anxious  Transfers Overall transfer level: Needs assistance Equipment used: Rolling walker (2 wheels) Transfers: Sit to/from Stand, Bed to chair/wheelchair/BSC Sit to Stand: Mod assist   Step pivot transfers: Mod assist       General transfer comment: Pt was mod A for momentum to get from sitting to standing from EOB and BSC with heavy cueing for hand placement and encouragment to perform activity especially off of BSC. Pt was perseverating on urinating when she stood.    Ambulation/Gait Ambulation/Gait assistance: Mod assist Gait Distance (Feet): 6 Feet Assistive device: Rolling walker (2 wheels) Gait Pattern/deviations: Step-to pattern, Decreased step length - right, Decreased step length - left, Trunk flexed Gait velocity: decreased cadence, multiple steps for very short distance. Gait velocity interpretation: <1.31 ft/sec, indicative of household ambulator   General Gait Details: Pt requires heavy verbal cues for maintaining body habitus near AD, physical assistance to maintain COM over BOS due to posterior lean. most likely secondary to fear of falling.       Balance Overall balance assessment: Needs assistance Sitting-balance support: No upper extremity supported, Feet supported Sitting balance-Leahy Scale: Fair Sitting balance - Comments: no overt LOB, CGA initially sitting EOB to get balance   Standing balance support: Reliant on assistive device for balance Standing balance-Leahy Scale: Poor Standing balance comment: posterior lean, needs min-mod assist       Cognition Arousal/Alertness: Awake/alert Behavior During  Therapy:  WFL for tasks assessed/performed Overall Cognitive Status: Impaired/Different from baseline Area of Impairment: Orientation, Following commands, Safety/judgement, Problem solving     Orientation Level: Place, Time, Situation     Following Commands: Follows one step commands inconsistently Safety/Judgement: Decreased awareness of safety, Decreased awareness of deficits   Problem Solving: Requires verbal cues, Requires tactile cues, Slow processing General Comments: asking same questions multiple times. Perseverating on urinating           General Comments General comments (skin integrity, edema, etc.): Continues with urinary incontinence though it is minimal amounts and would most likely be able to be managed well with briefs      Pertinent Vitals/Pain Pain Assessment Pain Assessment: No/denies pain           PT Goals (current goals can now be found in the care plan section) Acute Rehab PT Goals Patient Stated Goal: none stated PT Goal Formulation: With patient Time For Goal Achievement: 08/03/22 Potential to Achieve Goals: Good Progress towards PT goals: Progressing toward goals    Frequency    Min 3X/week      PT Plan Current plan remains appropriate       AM-PAC PT "6 Clicks" Mobility   Outcome Measure  Help needed turning from your back to your side while in a flat bed without using bedrails?: A Little Help needed moving from lying on your back to sitting on the side of a flat bed without using bedrails?: A Lot Help needed moving to and from a bed to a chair (including a wheelchair)?: A Lot Help needed standing up from a chair using your arms (e.g., wheelchair or bedside chair)?: A Lot Help needed to walk in hospital room?: A Lot Help needed climbing 3-5 steps with a railing? : A Lot 6 Click Score: 13    End of Session Equipment Utilized During Treatment: Gait belt Activity Tolerance: Patient tolerated treatment well Patient left: in  chair;with call bell/phone within reach;with chair alarm set;with family/visitor present Nurse Communication: Mobility status PT Visit Diagnosis: Unsteadiness on feet (R26.81);Other abnormalities of gait and mobility (R26.89);Muscle weakness (generalized) (M62.81);History of falling (Z91.81);Difficulty in walking, not elsewhere classified (R26.2)     Time: [id number] PT Time Calculation (min) (ACUTE ONLY): 23 min  Charges:  $Therapeutic Activity: 23-37 mins                     Tomma Rakers, DPT, CLT  Acute Rehabilitation Services Office: [phone] (Secure chat preferred)    Ander Purpura 07/22/2022, 2:02 PM

## 2022-07-23 DIAGNOSIS — T796XXA Traumatic ischemia of muscle, initial encounter: Secondary | ICD-10-CM | POA: Diagnosis not present

## 2022-07-23 MED ORDER — ACETAMINOPHEN 500 MG PO TABS: 1000.0000 mg | ORAL_TABLET | Freq: Three times a day (TID) | ORAL | Status: DC

## 2022-07-23 MED ORDER — TRAMADOL HCL 50 MG PO TABS: 50.0000 mg | ORAL_TABLET | Freq: Four times a day (QID) | ORAL | 0 refills | Status: DC | PRN

## 2022-07-23 MED ORDER — AMLODIPINE BESYLATE 5 MG PO TABS: 5.0000 mg | ORAL_TABLET | Freq: Every day | ORAL | Status: AC

## 2022-07-23 MED ORDER — ACETAMINOPHEN 500 MG PO TABS: ORAL_TABLET | ORAL | Status: AC

## 2022-07-23 MED ORDER — BISACODYL 10 MG RE SUPP
10.0000 mg | Freq: Once | RECTAL | Status: AC
  Administered 2022-07-23: 10 mg via RECTAL
  Filled 2022-07-23: qty 1

## 2022-07-23 MED ORDER — POLYETHYLENE GLYCOL 3350 17 G PO PACK: 17.0000 g | PACK | Freq: Every day | ORAL | Status: AC

## 2022-07-23 NOTE — Discharge Summary (Signed)
Physician Discharge Summary   Patient: Jillian Barker MRN: [id number] DOB: [birth year]  Admit date:     07/19/2022  Discharge date: 07/23/22  Discharge Physician: Cordelia Poche, MD   PCP: [facility]   Recommendations at discharge:  Hospital follow-up with PCP Continued pain management  Discharge Diagnoses: Principal Problem:   Rhabdomyolysis Active Problems:   Hypothermia   Closed rib fracture   Sternal manubrial dissociation, closed fracture   HTN (hypertension)   Elevated troponin   Leukocytosis   Fall  Resolved Problems:   * No resolved hospital problems. *  Hospital Course: Jillian Barker is a 89 y.o. female with a history of hypertension and osteoporosis. Patient presented secondary to fall and being found down for several days. She was found to have a nondisplaced anterior right sixth rib fracture in addition to sternal manubrial fracture. Trauma surgery consulted and recommended conservative management. Patient also found to have traumatic rhabdomyolysis from being down, which was treated with IV fluids.  Assessment and Plan:  Traumatic rhabdomyolysis Secondary to being down. CK total of 1,586 on admission. IV fluids started with improvement of CK to 783.   Closed sixth rib fracture Sternal manubrium fracture Patient evaluated by trauma surgery with recommendations for conservative management. Continue Tylenol and Tramadol on discharge.   Leukocytosis Mild. Stable. No evidence of infection.   Elevated troponin Mild elevation. No associated chest pain. No EKG changes concerning for ACS. Presumed demand ischemia.   Primary hypertension Continue amlodipine   Hypothermia Present on admission. Likely secondary to being found down. Resolved.  Constipation Improved with bowel regimen.   Consultants: General/Trauma surgery Procedures performed: None  Disposition: Skilled nursing facility Diet recommendation: Regular diet   DISCHARGE  MEDICATION: Allergies as of 07/23/2022       Reactions   Penicillins Other (See Comments)   Pass out .Marland Kitchenas a child    Azithromycin    Other Reaction(s): rash- lips/tongue        Medication List     STOP taking these medications    ibuprofen 200 MG tablet Commonly known as: ADVIL       TAKE these medications    acetaminophen 500 MG tablet Commonly known as: TYLENOL Take 2 tablets (1,000 mg total) by mouth every 8 (eight) hours for 5 days, THEN 1-2 tablets (500-1,000 mg total) every 8 (eight) hours as needed for up to 5 days for mild pain or moderate pain. Start taking on: July 23, 2022   alendronate 70 MG tablet Commonly known as: FOSAMAX Take 70 mg by mouth once a week.   amLODipine 5 MG tablet Commonly known as: NORVASC Take 1 tablet (5 mg total) by mouth daily. Start taking on: July 24, 2022   CALCIUM-VITAMIN D PO Take 1 tablet by mouth daily.   erythromycin ophthalmic ointment Commonly known as: Romycin Place 1 application into the left eye 3 (three) times daily.   pantoprazole 40 MG tablet Commonly known as: Protonix Take 1 tablet (40 mg total) by mouth 2 (two) times daily.   polyethylene glycol 17 g packet Commonly known as: MIRALAX / GLYCOLAX Take 17 g by mouth daily. Start taking on: July 24, 2022   traMADol 50 MG tablet Commonly known as: ULTRAM Take 1 tablet (50 mg total) by mouth every 6 (six) hours as needed for severe pain.   triamterene-hydrochlorothiazide 37.5-25 MG tablet Commonly known as: MAXZIDE-25 Take 1 tablet by mouth daily.        Follow-up Information  [facility] Follow up.   Contact information: [place] [address] [phone]                Discharge Exam: BP (!) 156/85 (BP Location: Right Arm)   Pulse (!) 59   Temp 98.6 F (37 C) (Oral)   Resp 15   Ht 4\' 11"  (1.499 m)   Wt 60 kg   SpO2 97%   BMI 26.72 kg/m   General exam: Appears calm and  comfortable Respiratory system: Clear to auscultation. Respiratory effort normal. Cardiovascular system: S1 & S2 heard, RRR. Gastrointestinal system: Abdomen is nondistended, soft and mildly tender. Normal bowel sounds heard. Central nervous system: Alert and oriented. Psychiatry: Judgement and insight appear normal. Mood & affect appropriate.   Condition at discharge: stable  The results of significant diagnostics from this hospitalization (including imaging, microbiology, ancillary and laboratory) are listed below for reference.   Imaging Studies: DG Tibia/Fibula Right  Result Date: 07/22/2022 CLINICAL DATA:  Fall EXAM: RIGHT TIBIA AND FIBULA - 2 VIEW COMPARISON:  None Available. FINDINGS: There is no evidence of fracture or other focal bone lesions. Severe degenerative changes of the partially visualized knee. Diffuse demineralization. Vascular calcifications. Soft tissues are unremarkable. IMPRESSION: 1. No osseous abnormality. 2. Severe degenerative changes of the partially visualized knee. Electronically Signed   By: Yetta Glassman M.D.   On: 07/22/2022 11:32   DG Tibia/Fibula Left  Result Date: 07/22/2022 CLINICAL DATA:  Several recent falls with bruising and pain along the tibia and fibula EXAM: LEFT TIBIA AND FIBULA - 2 VIEW COMPARISON:  Left knee radiographs dated 07/20/2022 FINDINGS: There is no evidence of fracture or other focal bone lesions. Moderate to severe tricompartmental degenerative changes of the knee. Loose bodies are again seen about the knee. Vascular calcifications. Soft tissues are unremarkable. IMPRESSION: 1. No acute fracture or malalignment. 2. Tricompartmental degenerative changes of the knee. Electronically Signed   By: Darrin Nipper M.D.   On: 07/22/2022 11:31   DG Abd 1 View  Result Date: 07/22/2022 CLINICAL DATA:  Abdominal pain.  Recent falls. EXAM: ABDOMEN - 1 VIEW COMPARISON:  CT chest, abdomen and pelvis 07/19/2022 FINDINGS: The bowel gas pattern appears  nonobstructed. No dilated loops of large or small bowel. Mild stool burden noted within the colon and rectum. Lumbar scoliosis with multilevel degenerative disc disease. Aortic atherosclerotic calcifications. IMPRESSION: Nonobstructive bowel gas pattern. Electronically Signed   By: Kerby Moors M.D.   On: 07/22/2022 11:30   DG Knee Left Port  Result Date: 07/20/2022 CLINICAL DATA:  Bilateral knee pain, fall on Friday EXAM: PORTABLE RIGHT KNEE - 1-2 VIEW; PORTABLE LEFT KNEE - 1-2 VIEW COMPARISON:  07/19/2022 FINDINGS: No fracture or dislocation of the bilateral knees. Severe tricompartmental arthrosis, worst in the patellofemoral compartments bilaterally. Loose body in the left superior joint space with a small knee joint effusion. Additional loose bodies in the posterior joint spaces bilaterally. Vascular calcinosis. Soft tissues unremarkable. IMPRESSION: 1. No fracture or dislocation of the bilateral knees. 2. Severe tricompartmental arthrosis, worst in the patellofemoral compartments bilaterally. 3. Loose bodies in the left superior joint space with a small knee joint effusion. Electronically Signed   By: Delanna Ahmadi M.D.   On: 07/20/2022 12:15   DG Knee Right Port  Result Date: 07/20/2022 CLINICAL DATA:  Bilateral knee pain, fall on Friday EXAM: PORTABLE RIGHT KNEE - 1-2 VIEW; PORTABLE LEFT KNEE - 1-2 VIEW COMPARISON:  07/19/2022 FINDINGS: No fracture or dislocation of the  bilateral knees. Severe tricompartmental arthrosis, worst in the patellofemoral compartments bilaterally. Loose body in the left superior joint space with a small knee joint effusion. Additional loose bodies in the posterior joint spaces bilaterally. Vascular calcinosis. Soft tissues unremarkable. IMPRESSION: 1. No fracture or dislocation of the bilateral knees. 2. Severe tricompartmental arthrosis, worst in the patellofemoral compartments bilaterally. 3. Loose bodies in the left superior joint space with a small knee joint  effusion. Electronically Signed   By: Delanna Ahmadi M.D.   On: 07/20/2022 12:15   DG CHEST PORT 1 VIEW  Result Date: 07/20/2022 CLINICAL DATA:  Right rib fractures. EXAM: PORTABLE CHEST 1 VIEW COMPARISON:  July 19, 2022. FINDINGS: The heart size and mediastinal contours are within normal limits. No acute pulmonary abnormality is noted. Right rib fractures noted on prior CT scan are not well visualized on this radiograph. IMPRESSION: No acute abnormality seen. Aortic Atherosclerosis (ICD10-I70.0). Electronically Signed   By: Marijo Conception M.D.   On: 07/20/2022 11:40   CT CHEST ABDOMEN PELVIS W CONTRAST  Result Date: 07/19/2022 CLINICAL DATA:  Trauma. EXAM: CT CHEST, ABDOMEN, AND PELVIS WITH CONTRAST TECHNIQUE: Multidetector CT imaging of the chest, abdomen and pelvis was performed following the standard protocol during bolus administration of intravenous contrast. RADIATION DOSE REDUCTION: This exam was performed according to the departmental dose-optimization program which includes automated exposure control, adjustment of the mA and/or kV according to patient size and/or use of iterative reconstruction technique. CONTRAST:  81mL OMNIPAQUE IOHEXOL 300 MG/ML  SOLN COMPARISON:  Chest radiograph 07/19/2022. FINDINGS: CT CHEST FINDINGS Cardiovascular: There is no cardiomegaly or pericardial effusion. Coronary vascular calcification of the LAD. Moderate atherosclerotic calcification of the thoracic aorta. No aneurysmal dilatation or dissection. The origins of the great vessels of the aortic arch and the central pulmonary arteries appear patent. Mediastinum/Nodes: No hilar or mediastinal adenopathy. Small hiatal hernia. The esophagus is grossly unremarkable. No mediastinal fluid collection. Lungs/Pleura: Trace right pleural effusion. No focal consolidation or pneumothorax. The central airways are patent. Musculoskeletal: Minimally displaced fracture of the anterior right sixth rib. Osteopenia with degenerative  changes of the spine. Age indeterminate, possibly acute fracture of the sternal manubrium. Correlation with point tenderness recommended. Sclerotic lesion in the proximal right humerus without aggressive features, likely an enchondroma. CT ABDOMEN PELVIS FINDINGS No intra-abdominal free air or free fluid. Hepatobiliary: The liver is unremarkable. No biliary dilatation. The gallbladder is unremarkable. Pancreas: Unremarkable. No pancreatic ductal dilatation or surrounding inflammatory changes. Spleen: Normal in size without focal abnormality. Adrenals/Urinary Tract: The adrenal glands are unremarkable. Bilateral renal cysts measure up to 5 cm in the inferior pole of the right kidney. There is no hydronephrosis on either side. There is symmetric enhancement and excretion of contrast by both kidneys. The urinary bladder is grossly unremarkable. Stomach/Bowel: Moderate stool throughout the colon. There is no bowel obstruction or active inflammation. The appendix is normal. Vascular/Lymphatic: Advanced aortoiliac atherosclerotic disease. The IVC is unremarkable. No portal venous gas. There is no adenopathy. Reproductive: The uterus and ovaries are grossly unremarkable. Other: None Musculoskeletal: Osteopenia with degenerative changes of the spine. Grade 1 L5-S1 anterolisthesis. No acute osseous pathology. IMPRESSION: 1. Minimally displaced fracture of the anterior right sixth rib. 2. Age indeterminate, possibly acute fracture of the sternal manubrium. Correlation with point tenderness recommended. 3. Trace right pleural effusion. 4. No acute/traumatic intra-abdominal or pelvic pathology. 5. Moderate colonic stool burden. No bowel obstruction. Normal appendix. 6.  Aortic Atherosclerosis (ICD10-I70.0). Electronically Signed   By: Laren Everts.D.  On: 07/19/2022 18:54   CT Head Wo Contrast  Result Date: 07/19/2022 CLINICAL DATA:  Head trauma, minor (Age >= 65y); Neck trauma (Age >= 65y) EXAM: CT HEAD WITHOUT  CONTRAST CT CERVICAL SPINE WITHOUT CONTRAST TECHNIQUE: Multidetector CT imaging of the head and cervical spine was performed following the standard protocol without intravenous contrast. Multiplanar CT image reconstructions of the cervical spine were also generated. RADIATION DOSE REDUCTION: This exam was performed according to the departmental dose-optimization program which includes automated exposure control, adjustment of the mA and/or kV according to patient size and/or use of iterative reconstruction technique. COMPARISON:  04/18/2020 FINDINGS: CT HEAD FINDINGS Brain: No evidence of acute infarction, hemorrhage, hydrocephalus, extra-axial collection or mass lesion/mass effect. Scattered low-density changes within the periventricular and subcortical white matter most compatible with chronic microvascular ischemic change. Mild diffuse cerebral volume loss. Vascular: Atherosclerotic calcifications involving the large vessels of the skull base. No unexpected hyperdense vessel. Skull: Normal. Negative for fracture or focal lesion. Sinuses/Orbits: No acute finding. Other: Negative for scalp hematoma. CT CERVICAL SPINE FINDINGS Alignment: Facet joints are aligned without dislocation or traumatic listhesis. Dens and lateral masses are aligned. Mildly exaggerated cervical lordosis. Degenerative grade 1 anterolisthesis of C4 on C5, C6 on C7, and C7 on T1. Skull base and vertebrae: No acute fracture. No primary bone lesion or focal pathologic process. Soft tissues and spinal canal: No prevertebral fluid or swelling. No visible canal hematoma. Disc levels:  Advanced multilevel cervical spondylosis. Upper chest: Negative. Other: Bilateral carotid atherosclerosis. IMPRESSION: 1. No acute intracranial abnormality. 2. No acute fracture or traumatic listhesis of the cervical spine. Electronically Signed   By: Davina Poke D.O.   On: 07/19/2022 16:12   CT Cervical Spine Wo Contrast  Result Date: 07/19/2022 CLINICAL  DATA:  Head trauma, minor (Age >= 65y); Neck trauma (Age >= 65y) EXAM: CT HEAD WITHOUT CONTRAST CT CERVICAL SPINE WITHOUT CONTRAST TECHNIQUE: Multidetector CT imaging of the head and cervical spine was performed following the standard protocol without intravenous contrast. Multiplanar CT image reconstructions of the cervical spine were also generated. RADIATION DOSE REDUCTION: This exam was performed according to the departmental dose-optimization program which includes automated exposure control, adjustment of the mA and/or kV according to patient size and/or use of iterative reconstruction technique. COMPARISON:  04/18/2020 FINDINGS: CT HEAD FINDINGS Brain: No evidence of acute infarction, hemorrhage, hydrocephalus, extra-axial collection or mass lesion/mass effect. Scattered low-density changes within the periventricular and subcortical white matter most compatible with chronic microvascular ischemic change. Mild diffuse cerebral volume loss. Vascular: Atherosclerotic calcifications involving the large vessels of the skull base. No unexpected hyperdense vessel. Skull: Normal. Negative for fracture or focal lesion. Sinuses/Orbits: No acute finding. Other: Negative for scalp hematoma. CT CERVICAL SPINE FINDINGS Alignment: Facet joints are aligned without dislocation or traumatic listhesis. Dens and lateral masses are aligned. Mildly exaggerated cervical lordosis. Degenerative grade 1 anterolisthesis of C4 on C5, C6 on C7, and C7 on T1. Skull base and vertebrae: No acute fracture. No primary bone lesion or focal pathologic process. Soft tissues and spinal canal: No prevertebral fluid or swelling. No visible canal hematoma. Disc levels:  Advanced multilevel cervical spondylosis. Upper chest: Negative. Other: Bilateral carotid atherosclerosis. IMPRESSION: 1. No acute intracranial abnormality. 2. No acute fracture or traumatic listhesis of the cervical spine. Electronically Signed   By: Davina Poke D.O.   On:  07/19/2022 16:12   DG Chest Port 1 View  Result Date: 07/19/2022 CLINICAL DATA:  fall EXAM: PORTABLE CHEST - 1 VIEW  COMPARISON:  06/22/2021 FINDINGS: Lungs are clear. Heart size and mediastinal contours are within normal limits. Aortic Atherosclerosis (ICD10-170.0). No effusion. Visualized bones unremarkable. IMPRESSION: No acute cardiopulmonary disease. Electronically Signed   By: Lucrezia Europe M.D.   On: 07/19/2022 15:34   DG Knee 2 Views Left  Result Date: 07/19/2022 CLINICAL DATA:  fall EXAM: LEFT KNEE - 1-2 VIEW COMPARISON:  None Available. FINDINGS: No fracture or dislocation. There is a small effusion in the suprapatellar bursa with scattered calcifications. Marginal spurs from the patella, medial femoral condyle and tibial plateau. Chondrocalcinosis in the lateral compartment. Mild osteopenia. Patchy femoral-popliteal arterial calcifications. IMPRESSION: 1. No fracture . 2. Knee effusion with mild degenerative changes. Electronically Signed   By: Lucrezia Europe M.D.   On: 07/19/2022 15:34   DG Knee 2 Views Right  Result Date: 07/19/2022 CLINICAL DATA:  fall EXAM: RIGHT KNEE - 1-2 VIEW COMPARISON:  07/04/2016 FINDINGS: No fracture, dislocation, or effusion. Progressive tricompartmental DJD. Patchy femoral-popliteal arterial calcifications. Regional soft tissues unremarkable. IMPRESSION: Progressive tricompartmental DJD. No acute findings. Electronically Signed   By: Lucrezia Europe M.D.   On: 07/19/2022 15:32   DG Lumbar Spine 2-3 Views  Result Date: 07/19/2022 CLINICAL DATA:  Fall, pain EXAM: LUMBAR SPINE - 3 VIEW COMPARISON:  None Available. FINDINGS: Osseous structures are osteopenic. There is loss of disc space and marginal osteophytes at each thoracic lumbar level consistent with degenerative disc disease. Dense atheromatous calcifications of the aorta. Mild compression deformity L1 should be considered age indeterminate. IMPRESSION: Osteopenia. Degenerative changes. Age-indeterminate mild  compression deformity L1. Electronically Signed   By: Sammie Bench M.D.   On: 07/19/2022 15:31   DG Pelvis Portable  Result Date: 07/19/2022 CLINICAL DATA:  fall EXAM: PORTABLE PELVIS 1-2 VIEWS COMPARISON:  None Available. FINDINGS: There is no evidence of pelvic fracture or diastasis. No pelvic bone lesions are seen. Mild right hip DJD. Patchy iliofemoral arterial calcifications. Lumbar levoscoliosis with multilevel spondylitic change noted. IMPRESSION: No acute findings. Mild right hip DJD. Electronically Signed   By: Lucrezia Europe M.D.   On: 07/19/2022 15:31    Microbiology: No results found for this or any previous visit.  Labs: CBC: Recent Labs  Lab 07/19/22 1359 07/20/22 0322 07/22/22 0308  WBC 17.8* 11.0* 13.2*  NEUTROABS 15.5*  --   --   HGB 14.5 11.1* 11.0*  HCT 44.3 33.3* 32.1*  MCV 95.9 94.9 93.0  PLT 247 [id number]   Basic Metabolic Panel: Recent Labs  Lab 07/19/22 1359 07/21/22 0541 07/22/22 0308  NA 139 136 137  K 3.6 3.2* 3.6  CL 106 104 108  CO2 20* 21* 22  GLUCOSE 96 117* 103*  BUN 25* 20 17  CREATININE 0.70 0.92 0.70  CALCIUM 8.8* 8.1* 8.0*   Liver Function Tests: Recent Labs  Lab 07/19/22 1359 07/21/22 0541 07/22/22 0308  AST 70* 48* 32  ALT 35 37 33  ALKPHOS 105 82 74  BILITOT 1.8* 1.2 1.2  PROT 7.0 5.6* 5.2*  ALBUMIN 3.9 2.8* 2.6*    Discharge time spent: 35 minutes.  Signed: Cordelia Poche, MD [facility] 07/23/2022

## 2022-07-23 NOTE — Progress Notes (Signed)
Mobility Specialist Progress Note   07/23/22 1030  Mobility  Activity Transferred from bed to chair  Level of Assistance Moderate assist, patient does 50-74%  Assistive Device Other (Comment) (HHA)  Range of Motion/Exercises Active;All extremities  Activity Response Tolerated well   Patient received in supine, reluctant to participate but agreeable with encouragement. Prior to OOB activity, patient in need of pericare secondary to urine/stool incontinence. RN present and assisted with pericare requiring total A + 2. Required max A for bed mobility and mod A to stand/pivot to recliner chair. Tolerated without incident but complained of pain "all over" throughout session. Was left in recliner with all needs met, call bell in reach.   Jillian Barker, BS EXP Mobility Specialist Please contact via SecureChat or Rehab office at [phone]

## 2022-07-23 NOTE — Plan of Care (Signed)

## 2022-07-23 NOTE — TOC Progression Note (Signed)
Transition of Care [facility]) - Progression Note    Patient Details  Name: Jillian Barker MRN: [id number] Date of Birth: 10/14/1928  Transition of Care [facility]) CM/SW Contact  Joanne Chars, LCSW Phone Number: 07/23/2022, 9:57 AM  Clinical Narrative:   CSW confirmed with [facility] that they can receive pt today.    CSW spoke with granddaughter Janett Billow about providing transportation.  She is able to do this, will be to the hospital around noon.      Expected Discharge Plan: [place] Barriers to Discharge: Continued Medical Work up, SNF Pending bed offer  Expected Discharge Plan and Services In-house Referral: Clinical Social Work   Post Acute Care Choice: [place] Living arrangements for the past 2 months: Single Family Home                                       Social Determinants of Health (SDOH) Interventions SDOH Screenings   Food Insecurity: No Food Insecurity (07/20/2022)  Housing: Low Risk  (07/20/2022)  Transportation Needs: No Transportation Needs (07/20/2022)  Utilities: Not At Risk (07/20/2022)  Tobacco Use: Low Risk  (06/24/2021)    Readmission Risk Interventions     No data to display

## 2022-07-23 NOTE — Progress Notes (Signed)
Occupational Therapy Treatment Patient Details Name: Jillian Barker MRN: [id number] DOB: November 01, 1928 Today's Date: 07/23/2022   History of present illness 89 y.o. female admitted 3/24 following a fall at home. PMHx: HTN, osteoporosis.   OT comments  Pt progressing toward established OT goals. On [facility] with nurse tech on arrival. Pt with difficulty having BM/urinating; repositioning with feet supported. Pt performing STS transfers x3 this session with mod A initially and min A on additional attempts. Pt with improved standing balance requiring min guard-min A intermittently. Pt continues to benefit from encouragement and reassurance of her safety. Continue to recommend post-acute rehabilitation.    Recommendations for follow up therapy are one component of a multi-disciplinary discharge planning process, led by the attending physician.  Recommendations may be updated based on patient status, additional functional criteria and insurance authorization.    Assistance Recommended at Discharge Frequent or constant Supervision/Assistance  Patient can return home with the following  A lot of help with walking and/or transfers;A lot of help with bathing/dressing/bathroom;Assist for transportation;Assistance with cooking/housework;Help with stairs or ramp for entrance;Direct supervision/assist for medications management   Equipment Recommendations  None recommended by OT    Recommendations for Other Services      Precautions / Restrictions Precautions Precautions: Fall Precaution Comments: nondisplaced anterior right sixth rib fracture with possible sternal manubrial fracture Restrictions Weight Bearing Restrictions: No Other Position/Activity Restrictions: Would follow sternal precautions until cleared fully by ortho.       Mobility Bed Mobility               General bed mobility comments: up on BSC on arrival; recliner on departure    Transfers Overall transfer level: Needs  assistance Equipment used: Rolling walker (2 wheels) Transfers: Sit to/from Stand, Bed to chair/wheelchair/BSC Sit to Stand: Min assist, Mod assist     Step pivot transfers: Min assist     General transfer comment: Mod A for initial rise; min A for additional rise. Min A for balance while steping back toward recliner.     Balance Overall balance assessment: Needs assistance Sitting-balance support: No upper extremity supported, Feet supported Sitting balance-Leahy Scale: Fair Sitting balance - Comments: no overt LOB. Supervision on toilet   Standing balance support: Reliant on assistive device for balance Standing balance-Leahy Scale: Poor Standing balance comment: intermittent min A for static standing balance.                           ADL either performed or assessed with clinical judgement   ADL Overall ADL's : Needs assistance/impaired Eating/Feeding: Set up;Sitting Eating/Feeding Details (indicate cue type and reason): eating crackers and banana on departure                 Lower Body Dressing: Moderate assistance;Sit to/from stand Lower Body Dressing Details (indicate cue type and reason): to don new underpants Toilet Transfer: Moderate assistance;Stand-pivot;Rolling walker (2 wheels);BSC/3in1 Toilet Transfer Details (indicate cue type and reason): Mod A for rise from [place] and Hygiene: Total assistance;Sit to/from stand Toileting - Clothing Manipulation Details (indicate cue type and reason): total A for posterior pericare as pt requires BUE support to maintain standing balance.     Functional mobility during ADLs: Minimal assistance General ADL Comments: Step pivot this sesssion    Extremity/Trunk Assessment Upper Extremity Assessment Upper Extremity Assessment: Generalized weakness   Lower Extremity Assessment Lower Extremity Assessment: Defer to PT evaluation  Vision       Perception     Praxis       Cognition Arousal/Alertness: Awake/alert Behavior During Therapy: WFL for tasks assessed/performed Overall Cognitive Status: Impaired/Different from baseline Area of Impairment: Orientation, Following commands, Safety/judgement, Problem solving                 Orientation Level: Place, Time, Situation     Following Commands: Follows one step commands inconsistently Safety/Judgement: Decreased awareness of safety, Decreased awareness of deficits   Problem Solving: Requires verbal cues, Requires tactile cues, Slow processing General Comments: perseverating on an individual who was kind to her earlier today and needing help from that specific person, however, agreeable to participate. Decreased confidence and signifiant fear of falling. Perseveratoing on having "osteoarthritis"        Exercises Exercises: Other exercises Other Exercises Other Exercises: STS x 3    Shoulder Instructions       General Comments urinary incontinence    Pertinent Vitals/ Pain       Pain Assessment Pain Assessment: Faces Faces Pain Scale: Hurts little more Pain Location: bil knees Pain Descriptors / Indicators: Aching Pain Intervention(s): Limited activity within patient's tolerance, Monitored during session  Home Living                                          Prior Functioning/Environment              Frequency  Min 2X/week        Progress Toward Goals  OT Goals(current goals can now be found in the care plan section)  Progress towards OT goals: Progressing toward goals  Acute Rehab OT Goals Patient Stated Goal: none stated OT Goal Formulation: With patient Time For Goal Achievement: 08/03/22 Potential to Achieve Goals: Fair ADL Goals Pt Will Perform Grooming: with min guard assist;standing Pt Will Perform Lower Body Dressing: with min assist;sit to/from stand Pt Will Transfer to Toilet: ambulating;regular height toilet;with min assist  Plan  Discharge plan remains appropriate;Frequency remains appropriate    Co-evaluation                 AM-PAC OT "6 Clicks" Daily Activity     Outcome Measure   Help from another person eating meals?: None Help from another person taking care of personal grooming?: None Help from another person toileting, which includes using toliet, bedpan, or urinal?: A Lot Help from another person bathing (including washing, rinsing, drying)?: A Lot Help from another person to put on and taking off regular upper body clothing?: A Little Help from another person to put on and taking off regular lower body clothing?: A Lot 6 Click Score: 17    End of Session Equipment Utilized During Treatment: Gait belt;Rolling walker (2 wheels)  OT Visit Diagnosis: Unsteadiness on feet (R26.81);Muscle weakness (generalized) (M62.81);History of falling (Z91.81);Other symptoms and signs involving cognitive function   Activity Tolerance Patient tolerated treatment well   Patient Left in chair;with call bell/phone within reach;with chair alarm set;with family/visitor present   Nurse Communication Mobility status        Time: [id number] OT Time Calculation (min): 25 min  Charges: OT General Charges $OT Visit: 1 Visit OT Treatments $Self Care/Home Management : 23-37 mins  Elder Cyphers, OTR/L [facility] Acute Rehabilitation Office: [phone]   Magnus Ivan 07/23/2022, 1:08 PM

## 2022-07-23 NOTE — Progress Notes (Signed)
Report given to Ashley

## 2022-07-23 NOTE — TOC Transition Note (Signed)
Transition of Care [facility]) - CM/SW Discharge Note   Patient Details  Name: Jillian Barker MRN: [id number] Date of Birth: [birth year]  [facility]) CM/SW Contact:  Joanne Chars, LCSW Phone Number: 07/23/2022, 1:23 PM   Clinical Narrative:   Pt discharging to [facility].  RN call [phone] for report.  Granddaughter Janett Billow will transport pt and will need pt brought down to main [place] tower entrance and also help getting her into the vehicle.    Final next level of care: Skilled Nursing Facility Barriers to Discharge: Barriers Resolved   Patient Goals and CMS Choice CMS Medicare.gov Compare Post Acute Care list provided to:: Patient Represenative (must comment) (granddaughter Janett Billow)    Discharge Placement                Patient chooses bed at: [place], [place] Patient to be transferred to facility by: granddaughter Janett Billow Name of family member notified: granddaughter Janett Billow in room Patient and family notified of of transfer: 07/23/22  Discharge Plan and Services Additional resources added to the After Visit Summary for   In-house Referral: Clinical Social Work   Post Acute Care Choice: [place]                               Social Determinants of Health (SDOH) Interventions SDOH Screenings   Food Insecurity: No Food Insecurity (07/20/2022)  Housing: Low Risk  (07/20/2022)  Transportation Needs: No Transportation Needs (07/20/2022)  Utilities: Not At Risk (07/20/2022)  Tobacco Use: Low Risk  (06/24/2021)     Readmission Risk Interventions     No data to display

## 2023-07-12 ENCOUNTER — Emergency Department (HOSPITAL_BASED_OUTPATIENT_CLINIC_OR_DEPARTMENT_OTHER)
Admission: EM | Admit: 2023-07-12 | Discharge: 2023-07-13 | Disposition: A | Discharge status: Skilled Nursing Facility | Attending: Emergency Medicine | Admitting: Emergency Medicine

## 2023-07-12 ENCOUNTER — Emergency Department (HOSPITAL_BASED_OUTPATIENT_CLINIC_OR_DEPARTMENT_OTHER)

## 2023-07-12 ENCOUNTER — Encounter (HOSPITAL_BASED_OUTPATIENT_CLINIC_OR_DEPARTMENT_OTHER): Payer: Self-pay | Admitting: Emergency Medicine

## 2023-07-12 DIAGNOSIS — R350 Frequency of micturition: Secondary | ICD-10-CM | POA: Insufficient documentation

## 2023-07-12 DIAGNOSIS — E871 Hypo-osmolality and hyponatremia: Secondary | ICD-10-CM | POA: Diagnosis not present

## 2023-07-12 DIAGNOSIS — I10 Essential (primary) hypertension: Secondary | ICD-10-CM | POA: Diagnosis not present

## 2023-07-12 DIAGNOSIS — M25561 Pain in right knee: Secondary | ICD-10-CM | POA: Diagnosis not present

## 2023-07-12 DIAGNOSIS — M545 Low back pain, unspecified: Secondary | ICD-10-CM | POA: Insufficient documentation

## 2023-07-12 DIAGNOSIS — M549 Dorsalgia, unspecified: Secondary | ICD-10-CM

## 2023-07-12 DIAGNOSIS — R109 Unspecified abdominal pain: Secondary | ICD-10-CM | POA: Insufficient documentation

## 2023-07-12 DIAGNOSIS — Z79899 Other long term (current) drug therapy: Secondary | ICD-10-CM | POA: Insufficient documentation

## 2023-07-12 LAB — CBC WITH DIFFERENTIAL/PLATELET
Abs Immature Granulocytes: 0.06 10*3/uL (ref 0.00–0.07)
Basophils Absolute: 0.1 10*3/uL (ref 0.0–0.1)
Basophils Relative: 1 %
Eosinophils Absolute: 0.4 10*3/uL (ref 0.0–0.5)
Eosinophils Relative: 4 %
HCT: 43.3 % (ref 36.0–46.0)
Hemoglobin: 14.5 g/dL (ref 12.0–15.0)
Immature Granulocytes: 1 %
Lymphocytes Relative: 16 %
Lymphs Abs: 1.5 10*3/uL (ref 0.7–4.0)
MCH: 29.8 pg (ref 26.0–34.0)
MCHC: 33.5 g/dL (ref 30.0–36.0)
MCV: 89.1 fL (ref 80.0–100.0)
Monocytes Absolute: 0.8 10*3/uL (ref 0.1–1.0)
Monocytes Relative: 8 %
Neutro Abs: 6.3 10*3/uL (ref 1.7–7.7)
Neutrophils Relative %: 70 %
Platelets: 190 10*3/uL (ref 150–400)
RBC: 4.86 MIL/uL (ref 3.87–5.11)
RDW: 15 % (ref 11.5–15.5)
WBC: 9 10*3/uL (ref 4.0–10.5)
nRBC: 0 % (ref 0.0–0.2)

## 2023-07-12 LAB — COMPREHENSIVE METABOLIC PANEL
ALT: 13 U/L (ref 0–44)
AST: 19 U/L (ref 15–41)
Albumin: 4.5 g/dL (ref 3.5–5.0)
Alkaline Phosphatase: 111 U/L (ref 38–126)
Anion gap: 11 (ref 5–15)
BUN: 27 mg/dL — ABNORMAL HIGH (ref 8–23)
CO2: 24 mmol/L (ref 22–32)
Calcium: 10.4 mg/dL — ABNORMAL HIGH (ref 8.9–10.3)
Chloride: 99 mmol/L (ref 98–111)
Creatinine, Ser: 1.5 mg/dL — ABNORMAL HIGH (ref 0.44–1.00)
GFR, Estimated: 32 mL/min — ABNORMAL LOW (ref >60)
Glucose, Bld: 101 mg/dL — ABNORMAL HIGH (ref 70–99)
Potassium: 4.6 mmol/L (ref 3.5–5.1)
Sodium: 134 mmol/L — ABNORMAL LOW (ref 135–145)
Total Bilirubin: 0.6 mg/dL (ref 0.0–1.2)
Total Protein: 8.2 g/dL — ABNORMAL HIGH (ref 6.5–8.1)

## 2023-07-12 LAB — LIPASE, BLOOD: Lipase: 43 U/L (ref 11–51)

## 2023-07-12 MED ORDER — FENTANYL CITRATE PF 50 MCG/ML IJ SOSY
25.0000 ug | PREFILLED_SYRINGE | Freq: Once | INTRAMUSCULAR | Status: AC
  Administered 2023-07-12: 25 ug via INTRAVENOUS
  Filled 2023-07-12: qty 1

## 2023-07-12 MED ORDER — LIDOCAINE 5 % EX PTCH
1.0000 | MEDICATED_PATCH | CUTANEOUS | Status: DC
  Administered 2023-07-12: 1 via TRANSDERMAL
  Filled 2023-07-12: qty 1

## 2023-07-12 MED ORDER — LACTATED RINGERS IV BOLUS
1000.0000 mL | Freq: Once | INTRAVENOUS | Status: AC
  Administered 2023-07-13: 1000 mL via INTRAVENOUS

## 2023-07-12 MED ORDER — IOHEXOL 300 MG/ML  SOLN: 100.0000 mL | Freq: Once | INTRAMUSCULAR | Status: DC | PRN

## 2023-07-12 NOTE — ED Notes (Signed)
 Patient transported to CT

## 2023-07-12 NOTE — ED Provider Notes (Signed)
  [place] EMERGENCY DEPARTMENT AT [facility] Provider Note   CSN: [id number] Arrival date & time: 07/12/23  1810     History {Add pertinent medical, surgical, social history, OB history to HPI:1} Chief Complaint  Patient presents with   Back Pain    Jillian Barker is a 89 y.o. female.  HPI      89yo female with history of hypertension, osteoporosis, osteoarthritis of right knee   Past Medical History:  Diagnosis Date   Allergy    Hypertension    Osteoporosis    Superficial thrombophlebitis      Home Medications Prior to Admission medications   Medication Sig Start Date End Date Taking? Authorizing Provider  alendronate (FOSAMAX) 70 MG tablet Take 70 mg by mouth once a week. 05/09/16   [provider]  amLODipine (NORVASC) 5 MG tablet Take 1 tablet (5 mg total) by mouth daily. 07/24/22   Narda Bonds, MD  CALCIUM-VITAMIN D PO Take 1 tablet by mouth daily.    [provider]  erythromycin [facility]) ophthalmic ointment Place 1 application into the left eye 3 (three) times daily. 08/11/16   [name], MD  pantoprazole (PROTONIX) 40 MG tablet Take 1 tablet (40 mg total) by mouth 2 (two) times daily. 06/23/21 06/23/22  Kathi Der, MD  polyethylene glycol (MIRALAX / GLYCOLAX) 17 g packet Take 17 g by mouth daily. 07/24/22   Narda Bonds, MD  traMADol (ULTRAM) 50 MG tablet Take 1 tablet (50 mg total) by mouth every 6 (six) hours as needed for severe pain. 07/23/22   Narda Bonds, MD  triamterene-hydrochlorothiazide (MAXZIDE-25) 37.5-25 MG per tablet Take 1 tablet by mouth daily.    [provider]      Allergies    Penicillins and Azithromycin    Review of Systems   Review of Systems  Physical Exam Updated Vital Signs BP (!) 153/77   Pulse 80   Temp 97.9 F (36.6 C)   Resp 16   SpO2 100%  Physical Exam  ED Results / Procedures / Treatments   Labs (all labs ordered are listed, but only abnormal results are  displayed) Labs Reviewed - No data to display  EKG None  Radiology No results found.  Procedures Procedures  {Document cardiac monitor, telemetry assessment procedure when appropriate:1}  Medications Ordered in ED Medications - No data to display  ED Course/ Medical Decision Making/ A&P   {   Click here for ABCD2, HEART and other calculatorsREFRESH Note before signing :1}                              Medical Decision Making  ***  {Document critical care time when appropriate:1} {Document review of labs and clinical decision tools ie heart score, Chads2Vasc2 etc:1}  {Document your independent review of radiology images, and any outside records:1} {Document your discussion with family members, caretakers, and with consultants:1} {Document social determinants of health affecting pt's care:1} {Document your decision making why or why not admission, treatments were needed:1} Final Clinical Impression(s) / ED Diagnoses Final diagnoses:  None    Rx / DC Orders ED Discharge Orders     None

## 2023-07-12 NOTE — ED Triage Notes (Signed)
 Pt bib [facility] from [facility] [place] with c/o back pain, knee pain x 3 days. RN at facility reports pt out of pain meds, was sent for pain management. Reports Unable to get in touch with facility MD. 81 HR 18 RR 98% 138/82

## 2023-07-13 LAB — URINALYSIS, ROUTINE W REFLEX MICROSCOPIC
Bilirubin Urine: NEGATIVE
Glucose, UA: NEGATIVE mg/dL
Hgb urine dipstick: NEGATIVE
Ketones, ur: NEGATIVE mg/dL
Nitrite: NEGATIVE
Protein, ur: NEGATIVE mg/dL
Specific Gravity, Urine: 1.016 (ref 1.005–1.030)
pH: 7 (ref 5.0–8.0)

## 2023-07-13 MED ORDER — TRAMADOL HCL 50 MG PO TABS: 50.0000 mg | ORAL_TABLET | Freq: Four times a day (QID) | ORAL | 0 refills | Status: AC | PRN

## 2023-07-13 MED ORDER — TRAMADOL HCL 50 MG PO TABS
50.0000 mg | ORAL_TABLET | Freq: Once | ORAL | Status: AC
  Administered 2023-07-13: 50 mg via ORAL
  Filled 2023-07-13: qty 1

## 2023-07-13 NOTE — ED Notes (Signed)
Bladder Scan = 68 ml

## 2023-07-13 NOTE — ED Notes (Signed)
-  Called Non-Emergency for [facility] transportation at 254am.

## 2023-07-13 NOTE — ED Provider Notes (Signed)
 12:10 AM Assumed care from Dr. Dalene Seltzer, please see their note for full history, physical and decision making until this point. In brief this is a 89 y.o. year old female who presented to the ED tonight with Back Pain     Back pain similar to previous with DDD. Pending urine as she is dehydrated, not retaining (~70 on bladder scan). Creatinine up compared to baseline, will need recheck with pcp. Getting fluids and reassess.   Urine clean. Patient without complaint. Urinated multiple times. Tolerates PO. Stable for d/c w/ home rehydration and pcp followup in 7 days for recheck of kidney function.   Discharge instructions, including strict return precautions for new or worsening symptoms, given. Patient and/or family verbalized understanding and agreement with the plan as described.   Labs, studies and imaging reviewed by myself and considered in medical decision making if ordered. Imaging interpreted by radiology.  Labs Reviewed  COMPREHENSIVE METABOLIC PANEL - Abnormal; Notable for the following components:      Result Value   Sodium 134 (*)    Glucose, Bld 101 (*)    BUN 27 (*)    Creatinine, Ser 1.50 (*)    Calcium 10.4 (*)    Total Protein 8.2 (*)    GFR, Estimated 32 (*)    All other components within normal limits  CBC WITH DIFFERENTIAL/PLATELET  LIPASE, BLOOD  URINALYSIS, ROUTINE W REFLEX MICROSCOPIC    CT L-SPINE NO CHARGE  Final Result    CT ABDOMEN PELVIS WO CONTRAST  Final Result      No follow-ups on file.    Jillian Barker, Barbara Cower, MD 07/13/23 [phone]

## 2023-07-14 ENCOUNTER — Telehealth (HOSPITAL_COMMUNITY): Payer: Self-pay

## 2023-07-14 LAB — URINE CULTURE: Culture: NO GROWTH

## 2023-07-18 ENCOUNTER — Other Ambulatory Visit: Payer: Self-pay

## 2023-07-18 ENCOUNTER — Encounter (HOSPITAL_COMMUNITY): Payer: Self-pay

## 2023-07-18 ENCOUNTER — Emergency Department (HOSPITAL_COMMUNITY)
Admission: EM | Admit: 2023-07-18 | Discharge: 2023-07-18 | Disposition: A | Discharge status: Home / Self Care | Attending: Student | Admitting: Student

## 2023-07-18 DIAGNOSIS — M791 Myalgia, unspecified site: Secondary | ICD-10-CM | POA: Insufficient documentation

## 2023-07-18 DIAGNOSIS — M549 Dorsalgia, unspecified: Secondary | ICD-10-CM | POA: Diagnosis present

## 2023-07-18 DIAGNOSIS — I10 Essential (primary) hypertension: Secondary | ICD-10-CM | POA: Diagnosis not present

## 2023-07-18 DIAGNOSIS — Z79899 Other long term (current) drug therapy: Secondary | ICD-10-CM | POA: Insufficient documentation

## 2023-07-18 DIAGNOSIS — G8929 Other chronic pain: Secondary | ICD-10-CM

## 2023-07-18 DIAGNOSIS — M545 Low back pain, unspecified: Secondary | ICD-10-CM

## 2023-07-18 MED ORDER — METHYLPREDNISOLONE 4 MG PO TBPK: ORAL_TABLET | ORAL | 0 refills | Status: AC

## 2023-07-18 MED ORDER — TRAMADOL HCL 50 MG PO TABS
50.0000 mg | ORAL_TABLET | Freq: Once | ORAL | Status: AC
  Administered 2023-07-18: 50 mg via ORAL
  Filled 2023-07-18: qty 1

## 2023-07-18 MED ORDER — PREDNISONE 20 MG PO TABS
40.0000 mg | ORAL_TABLET | Freq: Once | ORAL | Status: AC
  Administered 2023-07-18: 40 mg via ORAL
  Filled 2023-07-18: qty 2

## 2023-07-18 NOTE — Discharge Instructions (Addendum)
 Continue taking your home tramadol, Tylenol as needed for pain, please return if you have new injury, fall, or significant worsening pain despite treatment.

## 2023-07-18 NOTE — ED Notes (Signed)
 Granddaughter Diona Foley [phone] would like an update asap

## 2023-07-18 NOTE — ED Provider Notes (Signed)
 [place] EMERGENCY DEPARTMENT AT [facility] Provider Note   CSN: [id number] Arrival date & time: 07/18/23  1807     History  Chief Complaint  Patient presents with   Back Pain    Jillian Barker is a 89 y.o. female with past medical history significant for hypertension, advanced age she was at [facility] facility who presents with concern for chronic pain, she missed 1 dose of tramadol.  She denies any recent fall or other injury.  She reports generalized bodyaches, back pain.  No nausea, vomiting, no new numbness, tingling, no loss of control of bladder or bowels.   Back Pain      Home Medications Prior to Admission medications   Medication Sig Start Date End Date Taking? Authorizing Provider  alendronate (FOSAMAX) 70 MG tablet Take 70 mg by mouth once a week. 05/09/16   [provider]  amLODipine (NORVASC) 5 MG tablet Take 1 tablet (5 mg total) by mouth daily. 07/24/22   Narda Bonds, MD  CALCIUM-VITAMIN D PO Take 1 tablet by mouth daily.    [provider]  erythromycin [facility]) ophthalmic ointment Place 1 application into the left eye 3 (three) times daily. 08/11/16   [name], MD  pantoprazole (PROTONIX) 40 MG tablet Take 1 tablet (40 mg total) by mouth 2 (two) times daily. 06/23/21 06/23/22  Kathi Der, MD  polyethylene glycol (MIRALAX / GLYCOLAX) 17 g packet Take 17 g by mouth daily. 07/24/22   Narda Bonds, MD  traMADol (ULTRAM) 50 MG tablet Take 1 tablet (50 mg total) by mouth every 6 (six) hours as needed. 07/13/23   Mesner, Barbara Cower, MD  triamterene-hydrochlorothiazide (MAXZIDE-25) 37.5-25 MG per tablet Take 1 tablet by mouth daily.    [provider]      Allergies    Penicillins and Azithromycin    Review of Systems   Review of Systems  Musculoskeletal:  Positive for back pain.  All other systems reviewed and are negative.   Physical Exam Updated Vital Signs BP 134/67   Pulse 67   Temp 98 F (36.7  C)   Resp 18   Ht 4\' 11"  (1.499 m)   Wt 60 kg   SpO2 100%   BMI 26.72 kg/m  Physical Exam Vitals and nursing note reviewed.  Constitutional:      General: She is not in acute distress.    Appearance: Normal appearance.  HENT:     Head: Normocephalic and atraumatic.  Eyes:     General:        Right eye: No discharge.        Left eye: No discharge.  Cardiovascular:     Rate and Rhythm: Normal rate and regular rhythm.     Pulses: Normal pulses.  Pulmonary:     Effort: Pulmonary effort is normal. No respiratory distress.  Musculoskeletal:        General: No deformity.     Comments: Moving all 4 limbs spontaneously, normal strength throughout all upper and lower limbs to flexion, extension at bilateral hips, shoulders against resistance.  No midline tenderness of cervical, lumbar, or thoracic midline spine, some mild lumbar paraspinous muscle tenderness.  Normal sensation throughout.  Skin:    General: Skin is warm and dry.     Capillary Refill: Capillary refill takes less than 2 seconds.  Neurological:     Mental Status: She is alert and oriented to person, place, and time.  Psychiatric:  Mood and Affect: Mood normal.        Behavior: Behavior normal.     ED Results / Procedures / Treatments   Labs (all labs ordered are listed, but only abnormal results are displayed) Labs Reviewed - No data to display  EKG None  Radiology No results found.  Procedures Procedures    Medications Ordered in ED Medications  traMADol (ULTRAM) tablet 50 mg (50 mg Oral Given 07/18/23 1859)    ED Course/ Medical Decision Making/ A&P                                 Medical Decision Making Risk Prescription drug management.   Overall well-appearing 89 year old female here with chronic back pain, she missed 1 dose of tramadol at her facility, she is neurovascularly intact, she has stable vital signs, she had no recent fall or other injury, she has no signs of trauma, she has  no red flag symptoms for back pain, no history of cancer, no IV drug use, no focal neurologic deficits and reassuring musculoskeletal exam..  Given dose of tramadol in the ED, I think stable for discharge back to her facility at this time. Final Clinical Impression(s) / ED Diagnoses Final diagnoses:  None    Rx / DC Orders ED Discharge Orders     None         [place] 07/18/23 2009    Glendora Score, MD 07/19/23 [phone]

## 2023-07-18 NOTE — ED Triage Notes (Signed)
 Patient is coming from [facility]. Patient has chronic pain. And here for this.
